# Patient Record
Sex: Male | Born: 1935 | Race: White | Hispanic: No | Smoking: Never smoker
Health system: Southern US, Community
[De-identification: ages and names within clinical notes are randomized; demographics above are authoritative.]

## PROBLEM LIST (undated history)

## (undated) DIAGNOSIS — J449 Chronic obstructive pulmonary disease, unspecified: Secondary | ICD-10-CM

## (undated) DIAGNOSIS — F419 Anxiety disorder, unspecified: Secondary | ICD-10-CM

## (undated) DIAGNOSIS — S60219A Contusion of unspecified wrist, initial encounter: Secondary | ICD-10-CM

## (undated) DIAGNOSIS — N400 Enlarged prostate without lower urinary tract symptoms: Secondary | ICD-10-CM

## (undated) DIAGNOSIS — F4310 Post-traumatic stress disorder, unspecified: Secondary | ICD-10-CM

## (undated) DIAGNOSIS — F0390 Unspecified dementia without behavioral disturbance: Secondary | ICD-10-CM

## (undated) DIAGNOSIS — R296 Repeated falls: Secondary | ICD-10-CM

## (undated) DIAGNOSIS — I2510 Atherosclerotic heart disease of native coronary artery without angina pectoris: Secondary | ICD-10-CM

## (undated) HISTORY — PX: OTHER SURGICAL HISTORY: SHX169

---

## 2000-08-08 ENCOUNTER — Ambulatory Visit (HOSPITAL_COMMUNITY): Admission: RE | Admit: 2000-08-08 | Discharge: 2000-08-08 | Payer: Self-pay | Admitting: Family Medicine

## 2000-08-08 ENCOUNTER — Encounter: Payer: Self-pay | Admitting: Family Medicine

## 2000-08-10 ENCOUNTER — Ambulatory Visit (HOSPITAL_COMMUNITY): Admission: RE | Admit: 2000-08-10 | Discharge: 2000-08-10 | Payer: Self-pay | Admitting: Family Medicine

## 2000-08-10 ENCOUNTER — Encounter: Payer: Self-pay | Admitting: Family Medicine

## 2000-08-13 ENCOUNTER — Encounter: Payer: Self-pay | Admitting: Family Medicine

## 2000-08-13 ENCOUNTER — Ambulatory Visit (HOSPITAL_COMMUNITY): Admission: RE | Admit: 2000-08-13 | Discharge: 2000-08-13 | Payer: Self-pay | Admitting: Family Medicine

## 2000-08-15 ENCOUNTER — Encounter (HOSPITAL_COMMUNITY): Admission: RE | Admit: 2000-08-15 | Discharge: 2000-09-14 | Payer: Self-pay | Admitting: Family Medicine

## 2001-02-26 ENCOUNTER — Encounter: Payer: Self-pay | Admitting: Emergency Medicine

## 2001-02-26 ENCOUNTER — Emergency Department (HOSPITAL_COMMUNITY): Admission: EM | Admit: 2001-02-26 | Discharge: 2001-02-26 | Payer: Self-pay | Admitting: Emergency Medicine

## 2001-03-19 ENCOUNTER — Ambulatory Visit (HOSPITAL_COMMUNITY): Admission: RE | Admit: 2001-03-19 | Discharge: 2001-03-19 | Payer: Self-pay | Admitting: Family Medicine

## 2001-03-19 ENCOUNTER — Encounter: Payer: Self-pay | Admitting: Family Medicine

## 2001-03-26 ENCOUNTER — Other Ambulatory Visit: Admission: RE | Admit: 2001-03-26 | Discharge: 2001-03-26 | Payer: Self-pay | Admitting: Dermatology

## 2001-05-04 ENCOUNTER — Encounter: Payer: Self-pay | Admitting: Family Medicine

## 2001-05-04 ENCOUNTER — Ambulatory Visit (HOSPITAL_COMMUNITY): Admission: RE | Admit: 2001-05-04 | Discharge: 2001-05-04 | Payer: Self-pay | Admitting: Family Medicine

## 2001-12-06 ENCOUNTER — Ambulatory Visit (HOSPITAL_COMMUNITY): Admission: RE | Admit: 2001-12-06 | Discharge: 2001-12-06 | Payer: Self-pay | Admitting: Family Medicine

## 2001-12-06 ENCOUNTER — Encounter: Payer: Self-pay | Admitting: Family Medicine

## 2001-12-10 ENCOUNTER — Ambulatory Visit (HOSPITAL_COMMUNITY): Admission: RE | Admit: 2001-12-10 | Discharge: 2001-12-10 | Payer: Self-pay | Admitting: Pulmonary Disease

## 2002-08-12 ENCOUNTER — Ambulatory Visit (HOSPITAL_COMMUNITY): Admission: RE | Admit: 2002-08-12 | Discharge: 2002-08-12 | Payer: Self-pay | Admitting: Ophthalmology

## 2002-12-09 ENCOUNTER — Ambulatory Visit (HOSPITAL_COMMUNITY): Admission: RE | Admit: 2002-12-09 | Discharge: 2002-12-09 | Payer: Self-pay | Admitting: Ophthalmology

## 2003-03-06 ENCOUNTER — Ambulatory Visit (HOSPITAL_COMMUNITY): Admission: RE | Admit: 2003-03-06 | Discharge: 2003-03-06 | Payer: Self-pay | Admitting: Family Medicine

## 2003-05-06 ENCOUNTER — Ambulatory Visit (HOSPITAL_COMMUNITY): Admission: RE | Admit: 2003-05-06 | Discharge: 2003-05-06 | Payer: Self-pay | Admitting: Family Medicine

## 2004-04-02 ENCOUNTER — Emergency Department (HOSPITAL_COMMUNITY): Admission: EM | Admit: 2004-04-02 | Discharge: 2004-04-02 | Payer: Self-pay | Admitting: *Deleted

## 2004-07-12 ENCOUNTER — Ambulatory Visit (HOSPITAL_COMMUNITY): Admission: RE | Admit: 2004-07-12 | Discharge: 2004-07-12 | Payer: Self-pay | Admitting: Family Medicine

## 2004-08-24 ENCOUNTER — Ambulatory Visit (HOSPITAL_COMMUNITY): Admission: RE | Admit: 2004-08-24 | Discharge: 2004-08-24 | Payer: Self-pay | Admitting: Family Medicine

## 2005-01-08 ENCOUNTER — Emergency Department (HOSPITAL_COMMUNITY): Admission: EM | Admit: 2005-01-08 | Discharge: 2005-01-08 | Payer: Self-pay | Admitting: Emergency Medicine

## 2005-05-27 ENCOUNTER — Emergency Department (HOSPITAL_COMMUNITY): Admission: EM | Admit: 2005-05-27 | Discharge: 2005-05-27 | Payer: Self-pay | Admitting: Emergency Medicine

## 2005-06-04 ENCOUNTER — Emergency Department (HOSPITAL_COMMUNITY): Admission: EM | Admit: 2005-06-04 | Discharge: 2005-06-04 | Payer: Self-pay | Admitting: Emergency Medicine

## 2005-06-16 ENCOUNTER — Inpatient Hospital Stay (HOSPITAL_COMMUNITY): Admission: EM | Admit: 2005-06-16 | Discharge: 2005-06-18 | Payer: Self-pay | Admitting: Emergency Medicine

## 2005-06-18 ENCOUNTER — Ambulatory Visit: Payer: Self-pay | Admitting: Internal Medicine

## 2005-10-27 ENCOUNTER — Ambulatory Visit (HOSPITAL_COMMUNITY): Admission: RE | Admit: 2005-10-27 | Discharge: 2005-10-27 | Payer: Self-pay | Admitting: Family Medicine

## 2006-10-16 ENCOUNTER — Ambulatory Visit (HOSPITAL_COMMUNITY): Admission: RE | Admit: 2006-10-16 | Discharge: 2006-10-16 | Payer: Self-pay

## 2006-11-09 ENCOUNTER — Encounter (HOSPITAL_COMMUNITY): Admission: RE | Admit: 2006-11-09 | Discharge: 2006-12-09 | Payer: Self-pay | Admitting: Neurology

## 2006-12-14 ENCOUNTER — Encounter (HOSPITAL_COMMUNITY): Admission: RE | Admit: 2006-12-14 | Discharge: 2007-01-13 | Payer: Self-pay | Admitting: Neurology

## 2007-04-25 ENCOUNTER — Ambulatory Visit (HOSPITAL_COMMUNITY): Admission: RE | Admit: 2007-04-25 | Discharge: 2007-04-25 | Payer: Self-pay | Admitting: Family Medicine

## 2007-06-08 ENCOUNTER — Ambulatory Visit (HOSPITAL_COMMUNITY): Admission: RE | Admit: 2007-06-08 | Discharge: 2007-06-08 | Payer: Self-pay | Admitting: Family Medicine

## 2007-08-17 ENCOUNTER — Ambulatory Visit (HOSPITAL_COMMUNITY): Admission: RE | Admit: 2007-08-17 | Discharge: 2007-08-17 | Payer: Self-pay | Admitting: Family Medicine

## 2007-09-07 ENCOUNTER — Ambulatory Visit (HOSPITAL_COMMUNITY): Admission: RE | Admit: 2007-09-07 | Discharge: 2007-09-07 | Payer: Self-pay | Admitting: Family Medicine

## 2007-09-18 ENCOUNTER — Ambulatory Visit (HOSPITAL_COMMUNITY): Admission: RE | Admit: 2007-09-18 | Discharge: 2007-09-18 | Payer: Self-pay | Admitting: Pulmonary Disease

## 2008-03-15 ENCOUNTER — Emergency Department (HOSPITAL_COMMUNITY): Admission: EM | Admit: 2008-03-15 | Discharge: 2008-03-15 | Payer: Self-pay | Admitting: Emergency Medicine

## 2008-07-04 ENCOUNTER — Inpatient Hospital Stay (HOSPITAL_COMMUNITY): Admission: EM | Admit: 2008-07-04 | Discharge: 2008-07-09 | Payer: Self-pay | Admitting: Emergency Medicine

## 2008-07-04 ENCOUNTER — Encounter: Payer: Self-pay | Admitting: Orthopedic Surgery

## 2008-07-08 ENCOUNTER — Encounter: Payer: Self-pay | Admitting: Orthopedic Surgery

## 2008-07-09 ENCOUNTER — Encounter: Payer: Self-pay | Admitting: Orthopedic Surgery

## 2008-07-09 ENCOUNTER — Inpatient Hospital Stay
Admission: AD | Admit: 2008-07-09 | Discharge: 2011-02-03 | Disposition: A | Discharge status: Other Acute Inpatient Hospital | Payer: PRIVATE HEALTH INSURANCE | Source: Home / Self Care | Attending: Internal Medicine | Admitting: Internal Medicine

## 2008-07-09 DIAGNOSIS — I714 Abdominal aortic aneurysm, without rupture: Secondary | ICD-10-CM

## 2008-07-16 ENCOUNTER — Ambulatory Visit: Payer: Self-pay | Admitting: Orthopedic Surgery

## 2008-07-16 DIAGNOSIS — M25569 Pain in unspecified knee: Secondary | ICD-10-CM

## 2008-07-16 DIAGNOSIS — S52539A Colles' fracture of unspecified radius, initial encounter for closed fracture: Secondary | ICD-10-CM | POA: Insufficient documentation

## 2008-07-17 ENCOUNTER — Encounter: Payer: Self-pay | Admitting: Orthopedic Surgery

## 2008-07-17 ENCOUNTER — Ambulatory Visit (HOSPITAL_COMMUNITY): Admission: RE | Admit: 2008-07-17 | Discharge: 2008-07-17 | Payer: Self-pay | Admitting: Orthopedic Surgery

## 2008-07-30 ENCOUNTER — Telehealth: Payer: Self-pay | Admitting: Orthopedic Surgery

## 2008-08-04 ENCOUNTER — Telehealth: Payer: Self-pay | Admitting: Orthopedic Surgery

## 2008-08-07 ENCOUNTER — Encounter: Payer: Self-pay | Admitting: Orthopedic Surgery

## 2008-08-14 ENCOUNTER — Ambulatory Visit (HOSPITAL_COMMUNITY): Admission: RE | Admit: 2008-08-14 | Discharge: 2008-08-14 | Payer: Self-pay | Admitting: Family Medicine

## 2008-08-20 ENCOUNTER — Ambulatory Visit: Payer: Self-pay | Admitting: Orthopedic Surgery

## 2008-09-02 ENCOUNTER — Ambulatory Visit: Payer: Self-pay | Admitting: Internal Medicine

## 2008-09-04 ENCOUNTER — Ambulatory Visit (HOSPITAL_COMMUNITY): Admission: RE | Admit: 2008-09-04 | Discharge: 2008-09-04 | Payer: Self-pay | Admitting: Internal Medicine

## 2008-09-04 ENCOUNTER — Encounter: Payer: Self-pay | Admitting: Internal Medicine

## 2008-09-08 ENCOUNTER — Ambulatory Visit: Payer: Self-pay | Admitting: Internal Medicine

## 2008-09-08 ENCOUNTER — Encounter: Payer: Self-pay | Admitting: Internal Medicine

## 2008-09-08 ENCOUNTER — Ambulatory Visit (HOSPITAL_COMMUNITY): Admission: RE | Admit: 2008-09-08 | Discharge: 2008-09-08 | Payer: Self-pay | Admitting: Internal Medicine

## 2008-09-08 DIAGNOSIS — R1319 Other dysphagia: Secondary | ICD-10-CM

## 2008-09-09 ENCOUNTER — Encounter: Payer: Self-pay | Admitting: Internal Medicine

## 2008-09-16 ENCOUNTER — Ambulatory Visit (HOSPITAL_COMMUNITY): Admission: RE | Admit: 2008-09-16 | Discharge: 2008-09-16 | Payer: Self-pay | Admitting: Internal Medicine

## 2008-09-18 ENCOUNTER — Telehealth: Payer: Self-pay | Admitting: Orthopedic Surgery

## 2008-09-18 ENCOUNTER — Ambulatory Visit: Payer: Self-pay | Admitting: Orthopedic Surgery

## 2008-09-18 DIAGNOSIS — M1710 Unilateral primary osteoarthritis, unspecified knee: Secondary | ICD-10-CM | POA: Insufficient documentation

## 2009-01-07 ENCOUNTER — Emergency Department (HOSPITAL_COMMUNITY): Admission: EM | Admit: 2009-01-07 | Discharge: 2009-01-07 | Payer: Self-pay | Admitting: Emergency Medicine

## 2009-02-20 ENCOUNTER — Ambulatory Visit (HOSPITAL_COMMUNITY): Admission: RE | Admit: 2009-02-20 | Discharge: 2009-02-20 | Payer: Self-pay | Admitting: Internal Medicine

## 2009-03-21 ENCOUNTER — Ambulatory Visit (HOSPITAL_COMMUNITY): Admission: RE | Admit: 2009-03-21 | Discharge: 2009-03-21 | Payer: Self-pay | Admitting: Internal Medicine

## 2009-03-26 ENCOUNTER — Ambulatory Visit: Payer: Self-pay | Admitting: Internal Medicine

## 2009-03-26 DIAGNOSIS — K222 Esophageal obstruction: Secondary | ICD-10-CM

## 2009-03-27 ENCOUNTER — Ambulatory Visit (HOSPITAL_COMMUNITY): Admission: RE | Admit: 2009-03-27 | Discharge: 2009-03-27 | Payer: Self-pay | Admitting: Internal Medicine

## 2009-03-30 ENCOUNTER — Ambulatory Visit (HOSPITAL_COMMUNITY): Admission: RE | Admit: 2009-03-30 | Discharge: 2009-03-30 | Payer: Self-pay | Admitting: Internal Medicine

## 2009-04-07 ENCOUNTER — Encounter: Payer: Self-pay | Admitting: Internal Medicine

## 2009-04-14 ENCOUNTER — Encounter: Payer: Self-pay | Admitting: Gastroenterology

## 2009-06-05 ENCOUNTER — Ambulatory Visit (HOSPITAL_COMMUNITY): Admission: RE | Admit: 2009-06-05 | Discharge: 2009-06-05 | Payer: Self-pay | Admitting: Internal Medicine

## 2009-09-03 ENCOUNTER — Ambulatory Visit (HOSPITAL_COMMUNITY): Admission: RE | Admit: 2009-09-03 | Discharge: 2009-09-03 | Payer: Self-pay | Admitting: Internal Medicine

## 2009-10-18 ENCOUNTER — Ambulatory Visit: Admission: AD | Admit: 2009-10-18 | Discharge: 2009-10-18 | Payer: Self-pay | Admitting: Internal Medicine

## 2009-12-15 ENCOUNTER — Ambulatory Visit: Payer: Self-pay | Admitting: Orthopedic Surgery

## 2010-02-08 LAB — GLUCOSE, CAPILLARY: Glucose-Capillary: 85 mg/dL (ref 70–99)

## 2010-02-14 ENCOUNTER — Encounter (HOSPITAL_BASED_OUTPATIENT_CLINIC_OR_DEPARTMENT_OTHER): Payer: Self-pay | Admitting: Internal Medicine

## 2010-02-15 ENCOUNTER — Encounter: Payer: Self-pay | Admitting: Internal Medicine

## 2010-02-23 NOTE — Miscellaneous (Signed)
Summary: Nursing Home order  Nursing Home order   Imported By: Cammie Sickle 12/18/2009 14:22:19  _____________________________________________________________________  External Attachment:    Type:   Image     Comment:   External Document

## 2010-02-23 NOTE — Assessment & Plan Note (Signed)
Summary: requesting shots in [email or web address].cbt   Visit Type:  Follow-up Referring Provider:  [facility] Primary Provider:  Renard Matter  CC:  bilateral knee pain.  History of Present Illness: this is an 77 year old male, who is not a surgical candidate, who has bilateral knee osteoarthritis and presents for repeat injections. Last injection in August. We had a long discussion with him and his granddaughter and he says that the shot only helped a couple of days, maybe 3 days and then the pain comes back. He does take Tylenol and oxycodone for pain in his knees.  However, as well to get the shots.  2 shots were given one in each knee pain osteoarthritis   Verbal consent was obtained. The RIGHT knee was prepped with alcohol and ethyl chloride. 1 cc of depomedrol 40mg /cc and 4 cc of lidocaine 1% was injected. there were no complications.  Verbal consent was obtained. The knee was prepped with alcohol and ethyl chloride. 1 cc of depomedrol 40mg /cc and 4 cc of lidocaine 1% was injected. there were no complications.         Allergies: No Known Drug Allergies   Impression & Recommendations:  Problem # 1:  KNEE, ARTHRITIS, DEGEN./OSTEO (ICD-715.96) Assessment Deteriorated  Orders: Joint Aspirate / Injection, Large (20610) Depo- Medrol 40mg  (J1030)  Patient Instructions: 1)  You have received an injection of cortisone today. You may experience increased pain at the injection site. Apply ice pack to the area for 20 minutes every 2 hours and take 2 xtra strength tylenol every 8 hours. This increased pain will usually resolve in 24 hours. The injection will take effect in 3-10 days.  2)  Please schedule a follow-up appointment as needed.   Orders Added: 1)  Joint Aspirate / Injection, Large [20610] 2)  Depo- Medrol 40mg  [J1030]

## 2010-02-23 NOTE — Letter (Signed)
Summary: BPE ORDER  BPE ORDER   Imported By: Ave Filter 03/26/2009 14:26:49  _____________________________________________________________________  External Attachment:    Type:   Image     Comment:   External Document

## 2010-02-23 NOTE — Assessment & Plan Note (Signed)
Summary: e30,choking sensation.glu   Visit Type:  f/u Referring Provider:  [facility] Primary Care Provider:  Renard Matter  Chief Complaint:  choking sensation.  History of Present Illness: Mr. Jeremiah Johnson is a pleasant 77 year old gentleman who presents today for followup difficulty swallowing. He was last seen in August of 2010 at time of endoscopy. Please see findings below. The esophagus was dilated. He Is a poor historian due to a baseline Parkinson's/dementia. Apparently over the last several weeks he's had increasing episodes of "choking" with meals. According to his nephew-in-law, the nurses had to due the Heimlich Maneuver on him recently. At times he chews his food "forever" and still cannot swallow it and has to then spit it out. He denies any abdominal pain, nausea vomiting, heartburn. His bowel movements are "unpredictable", but he denies constipation or significant diarrhea. Denies blood in his stool or melena. Denies any weight loss, abd pain. Denies difficulty swallowing pills.  BPE prior to EGD in 8/10 also showed some esophageal dysmotility.  Current Medications (verified): 1)  Avodart 0.5 Mg Caps (Dutasteride) .... Take 1 Tablet By Mouth Once A Day 2)  Midodrine Hcl 2.5 Mg Tabs (Midodrine Hcl) .... Take 1 Tablet By Mouth Three Times A Day 3)  Sinemet 25-100 Mg Tabs (Carbidopa-Levodopa) .... Take 1 Tablet By Mouth Four Times A Day 4)  Meclizine 12.5 Mg .... As Needed 5)  Lexapro 20 Mg Tabs (Escitalopram Oxalate) .... Take 1 Tablet By Mouth Once A Day 6)  Flomax 0.4 Mg Caps (Tamsulosin Hcl) .... Take 1 Tablet By Mouth Once A Day 7)  Protonix 40 Mg Tbec (Pantoprazole Sodium) .... Take 1 Tablet By Mouth Once A Day 8)  Klonopin 0.5 Mg Tabs (Clonazepam) .... Take 1 Tab By Mouth At Bedtime 9)  Health Shakes .... Twice Daily 10)  Remeron 15 Mg Tabs (Mirtazapine) .... 1/2 Tablet Daily 11)  Colace 100 Mg Caps (Docusate Sodium) .... As Needed 12)  Benadryl 25 Mg Caps  (Diphenhydramine Hcl) .... As Needed 13)  Tylenol 650 Mg .... As Needed 14)  Oxyfast 20 Mg/ml .... 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 Moderate size hiatal hernia, otherwise normal stomach, D1, D2.  Family History: Father: Deceased  unknown Mother: Deceased age 46  Siblings: 10 brothers and 2 sisters No FH of CRC. Son deceased age 24, melanoma, also required renal transplant for unclear reasons, (donated by patient).  Social History: Widow 2 sons, deceased Retired Curator. Served in [employer].  Remote tob use, quit 1949. No alcohol.  Review of Systems General:  Denies fever, chills, and weight loss. ENT:  Complains of difficulty swallowing; denies nasal congestion and hoarseness. CV:  Denies chest pains, palpitations, dyspnea on exertion, and peripheral edema. Resp:  Denies dyspnea at rest, dyspnea with exercise, cough, and sputum. GI:  See HPI. MS:  Complains of joint pain / LOM. Derm:  Denies rash. Neuro:  Denies tremors. Endo:  Denies unusual weight change.  Vital Signs:  Patient profile:   77 year old male Height:      68.5  inches Temp:     97.9 degrees F oral Pulse rate:   60 / minute BP sitting:   110 / 68  (left arm) Cuff size:   regular  Vitals Entered By: Cloria Spring LPN (March 26, 1608 1:33 PM)  Physical Exam  General:  Well developed, well nourished, no acute distress. Head:  Normocephalic and atraumatic. Eyes:  Conjunctivae pink, no scleral icterus.  Mouth:  OP moist. Lungs:   Clear throughout to auscultation. Heart:  Regular rate and rhythm; no murmurs, rubs,  or bruits. Abdomen:  Bowel sounds normal.  Abdomen is soft, nontender, nondistended.  No rebound or guarding.  No hepatosplenomegaly, masses or hernias.  No abdominal bruits. Exam limited due to exam in wheelchair. Patient does not weight bear. Extremities:  No clubbing, cyanosis, edema or deformities noted. Neurologic:  Alert and  oriented x4;  Skin:  Intact without significant lesions or rashes. Psych:  Alert and cooperative. Normal mood and affect.  Impression & Recommendations:  Problem # 1:  DYSPHAGIA (ICD-787.29)  H/O esophageal stricture/Schatzki ring s/p dilation in 8/10. Component of esophageal dysmotility on BPE at that time. DDX includes recurrent esophageal stricture +/- oropharyngeal dysphagia +/- esophageal motility disorder. Recommend he chew food thoroughly and sit upright for all meals. Barium pill esophagram as next step. Further recommendations to follow.   Orders: Est. Patient Level III (96045)    I would like to thank Dr. Leanord Hawking for allowing [place] to take part in the care of this nice patient.

## 2010-02-23 NOTE — Letter (Signed)
Summary: internal other/consult report/03/26/2009  internal other/consult report/03/26/2009   Imported By: Cloria Spring LPN 78/46/9629 52:84:13  _____________________________________________________________________  External Attachment:    Type:   Image     Comment:   External Document

## 2010-02-23 NOTE — Letter (Signed)
Summary: Internal Other/Info to [facility]  Internal Other/Info to [facility]   Imported By: Cloria Spring LPN 16/10/9602 54:09:81  _____________________________________________________________________  External Attachment:    Type:   Image     Comment:   External Document

## 2010-02-25 ENCOUNTER — Ambulatory Visit (HOSPITAL_COMMUNITY)
Admit: 2010-02-25 | Discharge: 2010-02-25 | Disposition: A | Discharge status: Home / Self Care | Payer: Medicare Other | Attending: Internal Medicine | Admitting: Internal Medicine

## 2010-02-25 DIAGNOSIS — M7989 Other specified soft tissue disorders: Secondary | ICD-10-CM | POA: Insufficient documentation

## 2010-02-25 DIAGNOSIS — I743 Embolism and thrombosis of arteries of the lower extremities: Secondary | ICD-10-CM | POA: Insufficient documentation

## 2010-04-05 LAB — GLUCOSE, CAPILLARY: Glucose-Capillary: 91 mg/dL (ref 70–99)

## 2010-04-06 LAB — GLUCOSE, CAPILLARY: Glucose-Capillary: 209 mg/dL — ABNORMAL HIGH (ref 70–99)

## 2010-04-08 LAB — GLUCOSE, CAPILLARY
Glucose-Capillary: 137 mg/dL — ABNORMAL HIGH (ref 70–99)
Glucose-Capillary: 28 mg/dL — CL (ref 70–99)
Glucose-Capillary: 75 mg/dL (ref 70–99)
Glucose-Capillary: 82 mg/dL (ref 70–99)

## 2010-04-09 LAB — GLUCOSE, CAPILLARY: Glucose-Capillary: 83 mg/dL (ref 70–99)

## 2010-04-10 LAB — GLUCOSE, CAPILLARY
Glucose-Capillary: 111 mg/dL — ABNORMAL HIGH (ref 70–99)
Glucose-Capillary: 224 mg/dL — ABNORMAL HIGH (ref 70–99)

## 2010-04-11 LAB — GLUCOSE, CAPILLARY
Glucose-Capillary: 104 mg/dL — ABNORMAL HIGH (ref 70–99)
Glucose-Capillary: 176 mg/dL — ABNORMAL HIGH (ref 70–99)

## 2010-04-12 LAB — GLUCOSE, CAPILLARY: Glucose-Capillary: 101 mg/dL — ABNORMAL HIGH (ref 70–99)

## 2010-04-27 LAB — CBC
Hemoglobin: 11.7 g/dL — ABNORMAL LOW (ref 13.0–17.0)
MCHC: 33.3 g/dL (ref 30.0–36.0)
MCV: 96.8 fL (ref 78.0–100.0)
RBC: 3.64 MIL/uL — ABNORMAL LOW (ref 4.22–5.81)
RDW: 13.4 % (ref 11.5–15.5)

## 2010-04-27 LAB — PROTIME-INR: INR: 1.21 (ref 0.00–1.49)

## 2010-05-03 LAB — BASIC METABOLIC PANEL
BUN: 17 mg/dL (ref 6–23)
BUN: 26 mg/dL — ABNORMAL HIGH (ref 6–23)
CO2: 26 mEq/L (ref 19–32)
Calcium: 8.6 mg/dL (ref 8.4–10.5)
Chloride: 108 mEq/L (ref 96–112)
GFR calc non Af Amer: 46 mL/min — ABNORMAL LOW (ref >60)
Glucose, Bld: 116 mg/dL — ABNORMAL HIGH (ref 70–99)
Glucose, Bld: 99 mg/dL (ref 70–99)
Potassium: 3.9 mEq/L (ref 3.5–5.1)
Potassium: 4.1 mEq/L (ref 3.5–5.1)
Sodium: 139 mEq/L (ref 135–145)

## 2010-05-03 LAB — DIFFERENTIAL
Basophils Absolute: 0 10*3/uL (ref 0.0–0.1)
Basophils Relative: 0 % (ref 0–1)
Eosinophils Absolute: 0 10*3/uL (ref 0.0–0.7)
Eosinophils Relative: 1 % (ref 0–5)
Lymphocytes Relative: 19 % (ref 12–46)
Monocytes Absolute: 0.7 10*3/uL (ref 0.1–1.0)

## 2010-05-03 LAB — CBC
HCT: 33.5 % — ABNORMAL LOW (ref 39.0–52.0)
Hemoglobin: 12 g/dL — ABNORMAL LOW (ref 13.0–17.0)
MCHC: 36 g/dL (ref 30.0–36.0)
MCV: 94.6 fL (ref 78.0–100.0)
Platelets: 104 10*3/uL — ABNORMAL LOW (ref 150–400)
RDW: 12.9 % (ref 11.5–15.5)

## 2010-05-03 LAB — URINALYSIS, ROUTINE W REFLEX MICROSCOPIC
Bilirubin Urine: NEGATIVE
Ketones, ur: NEGATIVE mg/dL
Nitrite: NEGATIVE
Protein, ur: NEGATIVE mg/dL
pH: 5.5 (ref 5.0–8.0)

## 2010-05-03 LAB — POCT CARDIAC MARKERS: CKMB, poc: <1 ng/mL — ABNORMAL LOW (ref 1.0–8.0)

## 2010-06-08 NOTE — Group Therapy Note (Signed)
NAME:  Jeremiah Johnson, Jeremiah Johnson                 ACCOUNT NO.:  [id number]   MEDICAL RECORD NO.:  [id number]          PATIENT TYPE:  INP   LOCATION:  A323                          FACILITY:  [facility]   PHYSICIAN:  Angus G. Renard Matter, MD   DATE OF BIRTH:  Sep 06, 1918   DATE OF PROCEDURE:  DATE OF DISCHARGE:                                 PROGRESS NOTE   This patient has parkinsonism, was admitted following a fall and injury  to his right wrist, medial side of his right leg.  His condition  remained stable.  He was mentally confused through the night.  He was  admitted for pain control and placement in a nursing facility.  He  states that he would like to go home.   OBJECTIVE:  VITAL SIGNS:  Blood pressure 150/84, respirations 18, pulse  67, and temperature 98.4.  LUNGS:  Clear to P and A.  HEART:  Regular rhythm.  ABDOMEN:  No palpable organs or masses.  EXTREMITIES:  The patient has slightly deformed right wrist, small  abrasions in hand, and tenderness in medial thigh.   ASSESSMENT:  The patient was admitted following a fall.  He does have  contusion of his right wrist, evidence of Parkinsonism; therefore, it is  being observed for possible fracture in the right wrist.   PLAN:  To discuss with family today the options.  The patient does not  want to go to nursing facility, would rather go home to his apartment.  With the altered mental status through the night, we felt that he needed  to stay here today while his plans are being worked out regarding his  placement.      Angus G. Renard Matter, MD  Electronically Signed     AGM/MEDQ  D:  07/08/2008  T:  07/08/2008  Job:  [id number]

## 2010-06-08 NOTE — Op Note (Signed)
NAME:  Jeremiah Johnson, Jeremiah Johnson                 ACCOUNT NO.:  [id number]   MEDICAL RECORD NO.:  [id number]          PATIENT TYPE:  AMB   LOCATION:  DAY                           FACILITY:  [facility]   PHYSICIAN:  R. Roetta Sessions, M.D. DATE OF BIRTH:  [birth year]   DATE OF PROCEDURE:  09/08/2008  DATE OF DISCHARGE:                               OPERATIVE REPORT   PROCEDURE PERFORMED:  Esophagogastroduodenoscopy with Elease Hashimoto dilation  and biopsy.   INDICATIONS FOR PROCEDURE:  An 77 year old gentleman with a single  episode of esophageal dysphagia describes recently.  I saw him in the  office last week, obtained barium pill esophagram which revealed a  smooth narrowing of the EG junction where the 13-mm pill became stuck  and eventually disintegrated.  He comes for EGD with dilation as  appropriate.  Risks, benefits reviewed with Mr. [name], questions  answered, he is agreeable.   PROCEDURE NOTE:  O2 saturation, blood pressure, pulse, respirations  monitored throughout the entire procedure.   CONSCIOUS SEDATION:  Versed 3 mg IV, Demerol 50 mg IV divided doses.  Cetacaine spray for topical pharyngeal anesthesia.   INSTRUMENT USED:  Pentax video chip system.   FINDINGS:  Examination of the tubular esophagus revealed a peptic  appearing stricture with a component of a ring at the GE junction there  was a little heaped up inflamed appearing mucosa on the gastric side  circumferentially.  This did not appear to be a malignant stricture.  EG  junction was traversed with minimal resistance with a diagnostic  gastroscope.  Stomach:  Gas cavity was empty.  It insufflated well with  air.  Thorough examination of gastric mucosa including retroflexion in  proximal stomach and esophagogastric junction demonstrated only moderate  size hiatal hernia.  Pylorus was patent and easily traversed.  Examination of the bulb and second portion revealed no abnormalities.   THERAPEUTIC/DIAGNOSTIC MANEUVERS PERFORMED:  Scope  was withdrawn.  The  56-French [facility] dilator was passed to full insertion with ease.  A  look back revealed the stricture have been dilated nicely with minimal  bleeding without apparent complication.  Subsequently elected to biopsy  the somewhat heaped up tissue at the EG junction to __________  This was  done without difficulty or apparent complication.  The patient tolerated  the procedure well, was reacted in endoscopy.   IMPRESSION:  1. Inflammatory likely reflux induced stricture with a component of a      Schatzki's ring as described above status post dilation and biopsy.  2. Moderate size hiatal hernia, otherwise normal stomach, D1, D2.   RECOMMENDATIONS:  1. Begin Protonix 40 mg orally once daily, follow-up on path.  2. Further recommendations to follow.      Jonathon Bellows, M.D.  Electronically Signed     RMR/MEDQ  D:  09/08/2008  T:  09/08/2008  Job:  [id number]   cc:   Maxwell Caul, M.D.

## 2010-06-08 NOTE — Procedures (Signed)
NAME:  Jeremiah Johnson, Jeremiah Johnson                 ACCOUNT NO.:  [id number]   MEDICAL RECORD NO.:  [id number]          PATIENT TYPE:  OUT   LOCATION:  RESP                          FACILITY:  [facility]   PHYSICIAN:  Edward L. Juanetta Gosling, M.D.DATE OF BIRTH:  05-03-1918   DATE OF PROCEDURE:  DATE OF DISCHARGE:                            PULMONARY FUNCTION TEST   1. Spirometry shows a moderate ventilatory defect with evidence of      airflow obstruction most marked in the smaller airways.  2. Lung volumes show total lung capacity is 81% predicted, which is      normal.  However, it is borderline.  3. DLCO is mildly reduced.  4. Arterial blood gases are normal.  5. There is no significant bronchodilator improvement.      Edward L. Juanetta Gosling, M.D.  Electronically Signed     ELH/MEDQ  D:  09/08/2007  T:  09/08/2007  Job:  [id number]   cc:   Angus G. Renard Matter, MD  Fax: [phone]

## 2010-06-08 NOTE — H&P (Signed)
NAME:  Jeremiah Johnson, Jeremiah Johnson                 ACCOUNT NO.:  [id number]   MEDICAL RECORD NO.:  [id number]          PATIENT TYPE:  INP   LOCATION:  A323                          FACILITY:  [facility]   PHYSICIAN:  Angus G. Renard Matter, MD   DATE OF BIRTH:  [birth year]   DATE OF ADMISSION:  07/04/2008  DATE OF DISCHARGE:  LH                              HISTORY & PHYSICAL   HISTORY:  An 77 year old white male sent to the emergency room with a  chief problem being multiple falls.  Apparently, the patient fell  getting out of his car yesterday, did not lose consciousness.  The  patient does have a history of parkinsonism.  He has pain in his right  wrist.  X-rays showed slight diffuse osteopenia, no other acute findings  noted.  The patient lives alone.  He does have parkinsonism and  difficulty with movement.  The emergency room physician and the patient  felt that he should be admitted for placement and for pain control.   SOCIAL HISTORY:  The patient does not smoke or drink alcohol or use  drugs.   FAMILY HISTORY:  Positive for coronary artery disease and hypertension.   SURGICAL HISTORY:  The patient gave on one kidney in 1973.   PAST MEDICAL HISTORY:  1. Anxiety.  2. Parkinsonism.  3. Post-traumatic stress disorder which occurred as a result of World      War II.  4. The patient does have evidence of COPD, BPH, and coronary artery      disease.   ALLERGIES:  No drug allergies.   REVIEW OF SYSTEMS:  HEENT:  Negative except for occasional dyspnea.  GI:  No bowel irregularity or bleeding.  GU:  No dysuria or hematuria.   PHYSICAL EXAMINATION:  GENERAL:  Alert male.  VITAL SIGNS:  Blood pressure 129/73, respirations 18, pulse 65, and  temperature 97.5.  HEENT:  Eyes, PERRLA.  TMs negative.  Oropharynx benign.  NECK:  Supple.  No JVD or thyroid abnormalities.  LUNGS:  Clear to P and A.  HEART:  Regular rhythm.  ABDOMEN:  No palpable organs or masses.  No organomegaly.  EXTREMITIES:  Free of  edema.  His wrist is slightly deformed, tender to  palpation on right, small abrasion on dorsum of the right hand.   PERTINENT LABORATORY DATA:  Hemoglobin 12.0 and hematocrit of 33.5.  Chemistries within normal limits with exception of slight elevation of  glucose 116.  Urinalysis is essentially negative.   ASSESSMENT:  The patient was admitted with following a fall.  He does  have parkinsonism, has a contusion of his right wrist, observe for  possible fracture of his right wrist.      Angus G. Renard Matter, MD  Electronically Signed     AGM/MEDQ  D:  07/04/2008  T:  07/05/2008  Job:  [id number]

## 2010-06-08 NOTE — Group Therapy Note (Signed)
NAME:  Jeremiah Johnson, Jeremiah Johnson                 ACCOUNT NO.:  [id number]   MEDICAL RECORD NO.:  [id number]          PATIENT TYPE:  ORB   LOCATION:  S128                          FACILITY:  [facility]   PHYSICIAN:  Angus G. Renard Matter, MD   DATE OF BIRTH:  Dec 08, 1918   DATE OF PROCEDURE:  DATE OF DISCHARGE:                                 PROGRESS NOTE   SUBJECTIVE:  This patient was admitted to this facility following a  recent hospitalization.  He had a fall with injury to right wrist,  fracture of right wrist.  He does have a history of dementia and not  Parkinsonism.  Has remained stable in the facility.  We requested that  he will be seen by orthopedist, Dr. Romeo Apple.   OBJECTIVE:  VITAL SIGNS:  Blood pressure 146/87, respirations 20, pulse  60, temperature 97.  LUNGS:  Clear to P and A.  HEART:  Regular rhythm.  ABDOMEN:  No palpable organs or masses.   ASSESSMENT:  The patient has some slight tenderness of right wrist and  slight tenderness of medial thigh.   PLAN:  To continue current regimen.  Medication list reviewed.      Angus G. Renard Matter, MD  Electronically Signed     AGM/MEDQ  D:  07/15/2008  T:  07/16/2008  Job:  [id number]

## 2010-06-08 NOTE — Discharge Summary (Signed)
NAME:  Jeremiah Johnson, Jeremiah Johnson                 ACCOUNT NO.:  [id number]   MEDICAL RECORD NO.:  [id number]          PATIENT TYPE:  INP   LOCATION:  A323                          FACILITY:  [facility]   PHYSICIAN:  Angus G. Renard Matter, MD   DATE OF BIRTH:  October 29, 1918   DATE OF ADMISSION:  07/04/2008  DATE OF DISCHARGE:  06/16/2010LH                               DISCHARGE SUMMARY   DIAGNOSES:  1. Parkinsonism.  2. Contusion of right wrist, possible fracture.  3. Dementia.  4. Post-traumatic stress disorder.  5. History of chronic obstructive pulmonary disease.  6. Benign prostatic hypertrophy.  7. Coronary artery disease.   The patient's condition is stable at time of his discharge.   An 77 year old white male was sent to the emergency room with chief  problem being multiple falls.  Apparently the patient fell getting into  his car, did not lose consciousness.  The patient does have a history of  parkinsonism.  He has pain in his right wrist.  X-ray showed slight  diffuse osteopenia but no acute fracture noted.  The patient lives  alone.  Does have parkinsonism and difficulty with movement.  Emergency  room physician and the patient felt that he should be admitted for  placement and pain control.   EXAMINATION:  Alert male complaining of discomfort in his right arm.  VITAL SIGNS:  Blood pressure 129/73, respiration 18, pulse 65,  temperature 97.5.  HEENT:  Eyes:  PERRLA.  TMs negative.  Oropharynx benign.  NECK:  Supple.  No JVD or thyroid abnormalities.  LUNGS:  Clear to P and A.  HEART:  Regular rhythm.  ABDOMEN:  No palpable organs or masses.  No organomegaly.  EXTREMITIES:  Free of edema.  Wrist slightly deformed, tender to  palpation on the right.  Small abrasion on the dorsum of the right hand.   LABORATORY DATA:  CBC on admission:  WBC 8400, hemoglobin 12.0,  hematocrit 33.5.  BMET:  Sodium 139, potassium 4.1, chloride 108, CO2  26, glucose 116, BUN 17, creatinine 1.09, GFR greater than  60.  Urinalysis essentially negative.  TSH 0.743.  BNP on June 14 essentially  normal.   X-RAYS, RADIOLOGY:  Initial set of x-rays showed no acute findings,  advanced distal radiocarpal degenerative joint disease.  Slight diffuse  osteopenia.  Second x-ray:  Dorsal deformity of radius.  A buckle  fracture in this vicinity could not be excluded.  CT recommended.  X-ray  of his femur:  No acute findings, arteries with severe peripheral  arterial calcification.  X-ray of pelvis:  No acute findings.   HOSPITAL COURSE:  The patient at the time of his admission was placed on  a regular diet and vital signs were monitored.  He was placed on Vicodin  5/500 every 4 hours p.r.n. for pain.  Uroxatral 10 mg daily was  continued.  Sinemet 25/100 one t.i.d. was continued.  Voltaren 75 mg  b.i.d. continued.  He was given subcutaneous Lovenox daily.  The patient  continued to have some pain with his wrist throughout his hospital stay.  An orthopedic  consult was requested but the patient was not seen by  orthopedist.  It was rather obvious that the patient needed placement in  a nursing facility.  At times through the night he was mentally confused  but other times through the day, he appeared to be more alert.  A repeat  x-ray of his right wrist was done which still did not show evidence of  fracture, but a CT of the wrist was recommended to rule out more  detailed and possibly underlying subacute fracture.  Arrangements were  made for the patient be placed in a nursing facility.  He was able to be  discharged.   Discharged on the following medications:  1. Carbidopa/levodopa 25/100 one t.i.d.  2. __________ 2.5 mg t.i.d.  3. Prozac 20 mg daily.  4. Xanax 0.25 mg b.i.d. and 2 at bedtime.  5. Uroxatral 10 mg daily.  6. Avodart 0.5 mg daily.  7. Meclizine 12.5 mg q.4 h. p.r.n.   The patient was stable and improved at the time of his discharge.      Angus G. Renard Matter, MD  Electronically  Signed     AGM/MEDQ  D:  07/09/2008  T:  07/09/2008  Job:  [id number]

## 2010-06-08 NOTE — H&P (Signed)
NAME:  Jeremiah Johnson, Jeremiah Johnson                 ACCOUNT NO.:  [id number]   MEDICAL RECORD NO.:  [id number]          PATIENT TYPE:  INP   LOCATION:  A323                          FACILITY:  [facility]   PHYSICIAN:  Angus G. Renard Matter, MD   DATE OF BIRTH:  1918-02-12   DATE OF ADMISSION:  07/04/2008  DATE OF DISCHARGE:  LH                              HISTORY & PHYSICAL   ADDENDUM   MEDICATION LIST:  1. Mobic 7.5 mg daily.  2. Uroxatral 10 mg daily.  3. Sinemet 25/100 t.i.d.  4. Prozac 20 mg daily.      Angus G. Renard Matter, MD  Electronically Signed     AGM/MEDQ  D:  07/04/2008  T:  07/05/2008  Job:  [id number]

## 2010-06-08 NOTE — Group Therapy Note (Signed)
NAME:  Jeremiah Johnson, Jeremiah Johnson                 ACCOUNT NO.:  [id number]   MEDICAL RECORD NO.:  [id number]          PATIENT TYPE:  INP   LOCATION:  A323                          FACILITY:  [facility]   PHYSICIAN:  Angus G. Renard Matter, MD   DATE OF BIRTH:  19-Dec-1918   DATE OF PROCEDURE:  DATE OF DISCHARGE:                                 PROGRESS NOTE   SUBJECTIVE:  This patient has parkinsonism, who was admitted following a  fall and injury to his right wrist and medial side of his right leg.  His condition remains stable.  He was admitted for pain control and for  placement in nursing facility.   OBJECTIVE:  VITAL SIGNS:  Blood pressure 102/60, respirations 20, pulse  69, and temperature 97.1.  LUNGS:  Clear to P and A.  HEART:  Regular rhythm.  ABDOMEN:  No palpable organs or masses.  EXTREMITIES:  The patient has a slightly deformed right wrist, tender to  palpation on the right, small abrasions on the right hand, and  tenderness in the medial thigh.   ASSESSMENT:  The patient was admitted following a fall, does have  contusion of his right wrist, evidence of parkinsonism, observed for  possible fractures of the right wrist.   PLAN:  To obtain Orthopedic consult today and proceed placement in  nursing facility.      Angus G. Renard Matter, MD  Electronically Signed     AGM/MEDQ  D:  07/07/2008  T:  07/07/2008  Job:  [id number]

## 2010-06-08 NOTE — Consult Note (Signed)
NAME:  Jeremiah Johnson, Jeremiah Johnson                 ACCOUNT NO.:  [id number]   MEDICAL RECORD NO.:  [id number]          PATIENT TYPE:  INP   LOCATION:  A323                          FACILITY:  [facility]   PHYSICIAN:  Kofi A. Gerilyn Pilgrim, M.D. DATE OF BIRTH:  Dec 15, 1918   DATE OF CONSULTATION:  07/08/2008  DATE OF DISCHARGE:  07/09/2008                                 CONSULTATION   REASON FOR CONSULTATION:  Falls and Parkinson disease.   The patient is an 77 year old, who is seen in my office for parkinsonism  and orthostatic hypotension.  The patient has done well with midodrine  for orthostatic hypotension associated with gait instability and falls.  The patient was last seen in the office approximately 3 months ago and  he was doing fairly well.  He was ambulating with a cane and did not  report problems falling.  He has been maintained on midodrine 2.5 t.i.d.  and Sinemet 25/100 t.i.d.  The patient was seen on the repeat request of  the family to see the patient at a inpatient basis.  I did look over his  medication list and it is unclear that he is on midodrine, although it  was given to him from our office in February.  Please note that this  date of service for this note is July 08, 2008.  The patient reports  that he got out of his car and this did stand up and took a step and  stumbled and fell hitting the right hand while falling to the ground.  The patient specifically denies any loss of consciousness.  He does not  complain of having any lightheadedness or dizziness.  The patient did  sustain a fracture of the right wrist, however.   PAST MEDICAL HISTORY:  Significant for Parkinson disease, orthostatic  hypotension, abdominal aortic aneurysm, depression, PTSD,  osteoarthritis, anxiety disorder, COPD, benign prostatic hypertrophy.  There is also history of coronary artery disease.   SURGERIES:  None.   MEDICATIONS:  1. Prozac 20 mg.  2. Sinemet 25/100 t.i.d.  3. Midodrine 2.5 mg t.i.d.,  although it is unclear if the patient is      taking this.  4. Meclizine.  5. He also has Xanax, although it is unclear if he has taken Xanax 2.5      b.i.d.   PAST SURGICAL HISTORY:  The patient had a nephrectomy as a kidney  donation in 39.   FAMILY HISTORY:  It is positive for coronary disease and hypertension.   SOCIAL HISTORY:  The patient lives alone.  He has been relatively  functional doing all his ADLs and driving.   ALLERGIES:  None known.   REVIEW OF SYSTEMS:  Unremarkable other than stated in the history of  present illness.   PHYSICAL EXAMINATION:  GENERAL:  Shows a thin, pleasant gentleman, in no  acute distress.  VITAL SIGNS:  Temperature 98.3, pulse 69, respirations 80, blood  pressure 128/78, previous blood pressure 161/92.  HEENT EVALUATION:  Neck is supple.  Head is normocephalic, atraumatic.  EXTREMITIES:  Shows right forearm and hand to be  in the brace.  ABDOMEN:  Soft.  EXTREMITIES:  No significant edema.  MENTATION:  The patient is awake and alert.  He is somewhat heard of  hearing.  He does not recognize me.  He does follow commands well.  He  is oriented x2.  Speech is normal.  CRANIAL NERVE EVALUATION:  Pupils are equal, round, and react to light  and accommodation.  Extraocular movements are full.  Facial muscle  strength is symmetric.  Tongue is midline.  Uvula midline.  Shoulder  shrug normal.  MOTOR:  Obvious weakness of the right forearm and hand proximally,  actually pretty greater in right upper extremity.  He has pretty good  strength on other extremities.  Bulk and tone is normal.  COORDINATION:  I see no evidence of parkinsonism at this time.  He  actually has good movements.  No significant bradykinesia or cogwheeling  noted.  No tremors noted.  Reflexes are preserved.   ASSESSMENT AND PLAN:  Gait impairment likely multifactorial and include  osteoarthritis, parkinsonism, and orthostatic hypotension.  The  orthostatic hypotension is  likely related to parkinsonism.  I think, we  should restart the patient back on midodrine 2.5 mg t.i.d., continue  physical therapy, and minimize other psychotropic medication such as  benzodiazepines.      Kofi A. Gerilyn Pilgrim, M.D.  Electronically Signed     KAD/MEDQ  D:  07/15/2008  T:  07/16/2008  Job:  [id number]

## 2010-06-11 NOTE — Group Therapy Note (Signed)
NAME:  Jeremiah Johnson, Jeremiah Johnson                 ACCOUNT NO.:  [id number]   MEDICAL RECORD NO.:  [id number]          PATIENT TYPE:  OBV   LOCATION:  A327                          FACILITY:  [facility]   PHYSICIAN:  Angus G. Renard Matter, MD   DATE OF BIRTH:  1918-05-04   DATE OF PROCEDURE:  DATE OF DISCHARGE:                                   PROGRESS NOTE   This patient was admitted through the ED with nausea, vomiting, diarrhea,  and weakness.  He continues to have episode of diarrhea, had several stools  through the night, still feels weak.  Does not feel that he can manage at  home.   OBJECTIVE:  VITAL SIGNS:  Blood pressure 158/70, respirations 20, pulse 64,  temperature 98.4.  HEART:  Regular rhythm.  LUNGS:  Clear to P&A.  ABDOMEN:  Slight tenderness.   ASSESSMENT:  The patient was admitted with diarrhea and vomiting, was felt  to have a viral gastroenteritis most likely, although other etiologies  should be considered as well.   Plan to continue current IV fluids.  Repeat CBC, BMET.  Continue current  regimen.      Angus G. Renard Matter, MD  Electronically Signed     AGM/MEDQ  D:  06/16/2005  T:  06/16/2005  Job:  [id number]

## 2010-06-11 NOTE — Group Therapy Note (Signed)
NAME:  Jeremiah Johnson, Jeremiah Johnson                 ACCOUNT NO.:  [id number]   MEDICAL RECORD NO.:  [id number]          PATIENT TYPE:  OBV   LOCATION:  A327                          FACILITY:  [facility]   PHYSICIAN:  Angus G. Renard Matter, MD   DATE OF BIRTH:  10-Apr-1918   DATE OF PROCEDURE:  06/15/2005  DATE OF DISCHARGE:                                   PROGRESS NOTE   SUBJECTIVE:  This patient was admitted through the ED with nausea, vomiting,  diarrhea and weakness.  He continues to have some diarrhea, but is feeling  some better.   OBJECTIVE:  VITAL SIGNS:  Blood pressure 128/74, respirations 20, pulse 64,  temperature 97.4.  The patient's current WBC is 6700 with 78% neutrophils.  HEART:  Regular rhythm.  LUNGS:  Clear to P&A.  ABDOMEN:  No palpable organs  or masses.   ASSESSMENT:  The patient was admitted with nausea, vomiting and diarrhea.  He was felt to have a viral gastroenteritis.   PLAN:  I plan to continue current IV fluids and advance diet.      Angus G. Renard Matter, MD  Electronically Signed     AGM/MEDQ  D:  06/15/2005  T:  06/15/2005  Job:  [id number]

## 2010-06-11 NOTE — H&P (Signed)
Jeremiah Johnson, Jeremiah Johnson                 ACCOUNT NO.:  [id number]   MEDICAL RECORD NO.:  [id number]          PATIENT TYPE:  OBV   LOCATION:  A327                          FACILITY:  [facility]   PHYSICIAN:  Catalina Pizza, M.D.        DATE OF BIRTH:  1918/10/21   DATE OF ADMISSION:  06/14/2005  DATE OF DISCHARGE:  LH                                HISTORY & PHYSICAL   PRIMARY MEDICAL DOCTOR:  Angus G. Renard Matter, MD   NEUROLOGIST:  Kofi A. [place]onquah, M.D.   CHIEF COMPLAINT:  Nausea, vomiting, diarrhea, and weakness.   HISTORY OF PRESENT ILLNESS:  This is an 77 year old gentleman who was in  normal state of health yesterday afternoon, went to dinner last night, had a  steak, and at approximately 11 p.m. last night developed diarrhea, and then  nausea and vomiting. He has had several episodes of nausea and vomiting, and  several episodes of diarrhea. No hematochezia. No dark tarry stools. No  black stools. No blood in stools. The patient has  moderate discomfort in  his lower abdomen. He states he has had something similar to this years ago  and has not had any sick contacts that he knows of.   PAST MEDICAL HISTORY:  1.  PTSD secondary to WWII.  2.  Parkinson's disease.   PAST SURGICAL HISTORY:  1.  Appendectomy while in the service in 1945.  2.  He gave up one of his kidneys in 1973 to his son.   He has not had any other surgeries.   MEDICATIONS:  1.  Prozac 20 mg p.o. daily.  2.  Buspar 15 mg b.i.d.  3.  Xanax 0.25 mg p.r.n.  4.  Sinemet 25/100 t.i.d.  5.  A previous prescription for Zyrtec 10 mg p.o. daily.   REVIEW OF SYSTEMS:  The patient denies any chest pain. No headache. No  visual disturbance. No problems with dysphagia. No shortness of breath. Only  mentioned some lower abdominal pain. No significant problems with urination.  No lower extremity pain. No other neurologic complaints.   SOCIAL HISTORY:  The patient lives by himself in an apartment. He does not  smoke or drink. He has  several brothers and sisters who live in the area and  he is the oldest. He had one son who passed away of melanoma, but also had a  kidney transplant. He has one son that lives in [place], is an Radio producer.   FAMILY HISTORY:  Noncontributory.   OBJECTIVE:  VITAL SIGNS: Temperature is 98.1, blood pressure is 150/81,  respirations 16, pulse 80.  GENERAL: He is an elderly white male in no acute distress, lying in bed.  HEENT:  Pupils equal, round, and reactive to light and accommodation. No  scleral icterus. Oropharynx is moist. No JVD. No thyromegaly.  LUNGS: Clear to auscultation bilaterally.  CARDIOVASCULAR: Regular rate and rhythm. A questionable 1/6 systolic murmur  best appreciated at the left upper sternal border.  ABDOMEN: Soft, positive bowel sounds, some suprapubic tenderness to  palpation, but very mild. No hepatosplenomegaly.  EXTREMITIES: No  lower extremity edema. 2+ pulses in all extremities.  NEUROLOGIC: Cranial nerves II-XII intact. He does have a slow pill-rolling  tremor, worse in the right hand compared to the left.   Labwork obtained showed white count of 19,000, hemoglobin and platelet count  were within normal range. CMET revealed potassium of 5, BUN 19, and  creatinine 1.3 with a glucose of 183. Alkaline phosphatase was 136. All  other labs were normal with lipase of 19.   Abdominal film revealed multiple fluid levels, but no significant signs of  obstruction.   IMPRESSION:  This is an 77 year old gentleman with acute onset of nausea,  vomiting, diarrhea, and weakness.   ASSESSMENT/PLAN:  1.  Abdominal pain, nausea, vomiting, and weakness. The patient will be      given IV fluids. Very likely this is viral gastroenteritis-type picture.      At this time he has no acute abnormalities. Will treat with Zofran      p.r.n. and if he continues to have diarrhea may add on Imodium.  2.  PTSD.  Will continue on all medications that he was previously      prescribed for t  his.  3.  Parkinson's disease. Will continue on his Sinemet as previously      prescribed for this.   DISPOSITION:  The patient will be admitted for observation and will be  evaluated with CBC and BMET in the morning by Dr. Renard Matter to see if the  patient is feeling better, well enough to go home. He does live by himself  and is 77 years of age. He does have some family around which may be able to  assist him once the patient is well enough to go home.      Catalina Pizza, M.D.  Electronically Signed     ZH/MEDQ  D:  06/14/2005  T:  06/14/2005  Job:  [id number]

## 2010-06-11 NOTE — Consult Note (Signed)
NAME:  Jeremiah Johnson, Jeremiah Johnson                 ACCOUNT NO.:  [id number]   MEDICAL RECORD NO.:  [id number]          PATIENT TYPE:  INP   LOCATION:  A327                          FACILITY:  [facility]   PHYSICIAN:  Lionel December, M.D.    DATE OF BIRTH:  01/21/1919   DATE OF CONSULTATION:  DATE OF DISCHARGE:                                   CONSULTATION   REASON FOR CONSULTATION:  Diarrhea.   REQUESTING PHYSICIAN:  Angus G. McInnis, MD   HISTORY OF PRESENT ILLNESS:  Mr. Plack is an 77 year old Caucasian gentleman  who was admitted three days ago with acute onset of nausea, vomiting, and  diarrhea.  He went out to eat at a restaurant and had a [place] steak,  mashed potatoes, and green beans.  That evening around midnight, he woke up  with diarrhea, nausea and vomiting.  His vomiting has improved at this  point, but he continues to have diarrhea.  Yesterday, he had 10 stools.  Today, he has already had more than six.  He denies any blood in the stool  or melena.  He is having some abdominal discomfort which he feels is related  to cramps and gas.  No fever.  T max was 99.1.  He denies any recent  antibiotic use.  He has not been traveling or camping.  No ill contacts.  Generally, before this illness, he had regular bowel movements.  He has  intermittent heartburn related to certain foods.  No weight loss.   On admission, his white count was 19,100 with 92% neutrophils.  Today his  white count is 6700.  He is heme negative x2.  C. diff, O&P, and WBC all  negative.  No stool culture was ordered.  He had a CT of the abdomen and  pelvis, which revealed acute abdominal aortic aneurysm measuring 3.4 x 3.6  cm, status post left nephrectomy, minimal dilatation of intra- and extra  hepatobiliary ducts, prostatic enlargement.  LFTs are normal.  Lipase 19.   MEDICATIONS PRIOR TO ADMISSION:  1.  Prozac 20 mg daily.  2.  BuSpar 15 mg b.i.d.  3.  Xanax 0.25 mg t.i.d. p.r.n.  4.  Sinemet 25/100 mg t.i.d.  5.   Zyrtec 10 mg daily p.r.n.   ALLERGIES:  No known drug allergies.   PAST MEDICAL HISTORY:  1.  Post-traumatic stress disorder, World War II.  2.  Parkinson's disease.  3.  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  4.  Status post appendectomy.  5.  Patient has never had a colonoscopy and was actually being scheduled for      one before he recently fell and injured his chin, requiring stitches.      He postponed his appointment.   FAMILY HISTORY:  Negative for colorectal cancer.  Son died of melanoma at  age 40 and required liver transplant 28 years prior to that for some sort of  kidney disorder but not cancer.   SOCIAL HISTORY:  He is widowed.  Two sons who are deceased.  He has a  granddaughter, two  great-grandchildren.  He is retired from being a  Curator.  He served in World War II.  Remote tobacco use, quit in 1949.  No  alcohol use.   REVIEW OF SYSTEMS:  See HPI for GI and constitutional.  CARDIOPULMONARY:  No  chest pain or shortness of breath.   PHYSICAL EXAMINATION:  VITAL SIGNS:  Temp 98.1, pulse 70, respirations 18,  blood pressure 125/79.  Weight 171.6.  Height 68 inches.  GENERAL:  A pleasant elderly Caucasian male in no acute distress.  SKIN:  Warm and dry.  No jaundice.  HEENT:  Conjunctivae are pink.  Sclerae are anicteric.  Oropharyngeal mucosa  moist and pink.  NECK:  No lymphadenopathy.  CHEST:  Lungs are clear to auscultation.  HEART:  Regular rate and rhythm.  Normal S1 and S2.  No murmurs, rubs or  gallops.  ABDOMEN:  Positive bowel sounds.  Soft, nontender, nondistended.  No  organomegaly or masses.  No rebound tenderness or guarding.  No abdominal  bruits or hernias.  EXTREMITIES:  No edema.   LABS:  As mentioned in the HPI.  In addition, hemoglobin is 13.1, hematocrit  38.1.  Platelets on admission were 160,000, down to 121,000.  Sodium 137,  potassium 4.1, BUN 9, creatinine 1.2, glucose 95.  Total bilirubin 0.9,  alkaline  phosphatase 136, AST 28, ALT 14, albumin 4.1.   IMPRESSION:  Patient is an 77 year old gentleman with acute onset of nausea,  vomiting, and diarrhea, now for 4-5 days duration.  Initially, his white  count was significantly elevated, and now it has normalized.  Stools are  hemoccult negative.  No stool culture was obtained, but C. diff, O&P, and  WBC were all negative.  Suspect gastroenteritis, possibly food-borne  illness, bacterial gastroenteritis.  C. diff less likely with no recent  antibiotic use.  Doubt viral gastroenteritis, given basic CBC and  differential, which is more indicative for bacterial infection.   RECOMMENDATIONS:  1.  Stool culture, repeat C. diff.  2.  Imodium p.r.n.  3.  Consider Cipro and Flagyl p.o., to discuss further with Dr. Karilyn Cota.  4.  If he is not significantly better in the next 24-48 hours, may consider      flexible sigmoidoscopy.      Tana Coast, P.A.      Lionel December, M.D.  Electronically Signed    LL/MEDQ  D:  06/17/2005  T:  06/17/2005  Job:  [id number]   cc:   Angus G. Renard Matter, MD  Fax: [phone]

## 2010-06-11 NOTE — Procedures (Signed)
   NAME:  Jeremiah Johnson, Jeremiah Johnson                           ACCOUNT NO.:  [id number]   MEDICAL RECORD NO.:  [id number]                   PATIENT TYPE:  OUT   LOCATION:  DFTL                                 FACILITY:  [facility]   PHYSICIAN:  Edward L. Juanetta Gosling, M.D.             DATE OF BIRTH:  14-May-1918   DATE OF PROCEDURE:  12/10/2001  DATE OF DISCHARGE:                                    STRESS TEST   INDICATIONS FOR PROCEDURE:  The patient is undergoing graded exercise  testing basically as part of a physical examination.  He has no  contraindications to graded exercise testing.   DESCRIPTION OF PROCEDURE:  The patient exercised for 2 minutes and 30  seconds on the Bruce protocol reaching and sustaining 4.6 METS.  His maximum  recorded heart rate was 109, which is 78% of his age-predicted maximal heart  rate.  He had no symptoms during exercise and stopped exercise because of  arthritis in his knees.  Blood pressure response to exercise was normal, and  there were no electrocardiographic changes suggestive of inducible ischemia.   IMPRESSION:  1. Poor exercise tolerance.  2. Normal blood pressure response to exercise.  3. No evidence of ischemia.  4. No symptoms during exercise.  5. Submaximal exercise reached with 78% instead of 85% of age-predicted     maximal heart rate.                                               Edward L. Juanetta Gosling, M.D.    ELH/MEDQ  D:  12/10/2001  T:  12/10/2001  Job:  [id number]

## 2010-06-11 NOTE — Discharge Summary (Signed)
NAME:  Jeremiah Johnson, Jeremiah Johnson                 ACCOUNT NO.:  [id number]   MEDICAL RECORD NO.:  [id number]          PATIENT TYPE:  INP   LOCATION:  A327                          FACILITY:  [facility]   PHYSICIAN:  Angus G. Renard Matter, MD   DATE OF BIRTH:  January 09, 1919   DATE OF ADMISSION:  06/14/2005  DATE OF DISCHARGE:  05/26/2007LH                                 DISCHARGE SUMMARY   DIAGNOSES:  1.  Gastroenteritis, viral.  2.  History of abdominal aortic aneurysm.  3.  History of Parkinson's disease.   CONDITION ON DISCHARGE:  The patient's condition is stable at time of  discharge.   HISTORY:  This 77 year old male was in his normal state of health, went to  dinner, had a steak and at approximately 11 p.m. developed diarrhea, nausea  and vomiting.  He had several episodes of nausea and vomiting, several  episodes of diarrhea.  No hematochezia.  The patient was in moderate  discomfort at the time of his admission in his lower abdomen.   PHYSICAL EXAMINATION:  VITAL SIGNS:  Blood pressure 150/81, respirations 16,  pulse 80, temperature 98.1.  GENERAL:  An elderly male, lying in bed.  HEENT:  Eyes -- PERRLA; TM, nares oropharynx benign.  NECK:  Supple, no JVD or thyroid abnormalities.  LUNGS:  Clear to percussion and auscultation.  HEART:  Regular rhythm, no murmurs.  Questionable 1/6 systolic murmur, left  upper sternal border.  ABDOMEN:  Soft, no palpable organs nor masses.  No organomegaly.  EXTREMITIES:  Free of edema.  NEUROLOGIC:  Cranial nerves intact.  Does have a slow _pill________ -rolling  tremor.   LABORATORY DATA:  Initial CBC:  WBC 19,100, hemoglobin 15.1, hematocrit  44.1.  CBC (Jun 18, 2005):  WBC 5900, hemoglobin 12.7, hematocrit 37.4.  CHEMISTRIES (on admission):  Sodium 140, potassium 5, chloride 106, CO2 26,  glucose 183, BUN 19, creatinine 1.3, calcium 9.4.  CHEMISTRIES (Jun 17, 2005):  Sodium 137, potassium 4.1, chloride 116, CO2 18, glucose 95, BUN 9,  creatinine 1.2,  calcium 8.3.  LIVER ENZYMES:  SGOT 28, SGPT 14, alkaline  phosphatase 136,  bilirubin 0.9, lipase 19.  Urinalysis negative.  Stool  negative for C. difficile.  No ova or parasites.   X-RAYS:  Abdominal acute with PA of chest:  Cardiomegaly with mild chronic  bronchitic changes.  Nonspecific bowel gas pattern.  Fluid-filled loops of  bowel within the abdomen.  Abdominal CT:  Abdominal aortic aneurysm  measuring 2.8 x 3.5; no acute abdominal process.  Evidence of left  nephrectomy.  CT of the pelvis.  Prostate gland enlargement, no pelvic  abnormality.   HOSPITAL COURSE:  The patient at the time of his admission was placed on  intravenous fluids, normal saline at 100 cc/hr, liquids clear.  Vital signs  were monitored.  Zofran 4 mg IV was given every 8 hr p.r.n. for nausea.  His  regular medications were continued shortly after admission (Prozac 20 mg  daily, BuSpar 15 mg b.i.d., Alprazolam 0.25 mg t.i.d., Carbidopa/levodopa  p.o. t.i.d. 25/100).  The patient gradually improved  and was placed on a  soft diet on Jun 15, 2005.  His stools were examined for C. difficile, O&P,  __________ wbc's; this was negative.  The patient was given Immodium for  diarrhea which developed.   The patient showed progressive improvement and was ready to be discharged  after 2 days hospitalization to follow as an outpatient.   DISCHARGE MEDICATIONS:  1.  Immodium capsules one t.i.d.  2.  Phenergan 25 mg q.4 h. p.r.n. nausea.  3.  Prozac 20 mg daily.  4.  BuSpar 15 mg b.i.d.  5.  Cinemet 25/100 t.i.d.      Angus G. Renard Matter, MD  Electronically Signed     AGM/MEDQ  D:  07/05/2005  T:  07/05/2005  Job:  [id number]

## 2010-06-11 NOTE — Group Therapy Note (Signed)
NAME:  Jeremiah Johnson, Jeremiah Johnson                 ACCOUNT NO.:  [id number]   MEDICAL RECORD NO.:  [id number]          PATIENT TYPE:  INP   LOCATION:  A327                          FACILITY:  [facility]   PHYSICIAN:  Angus G. Renard Matter, MD   DATE OF BIRTH:  August 29, 1918   DATE OF PROCEDURE:  06/17/2005  DATE OF DISCHARGE:                                   PROGRESS NOTE   This patient was admitted through the hospital ED with nausea, vomiting and  diarrhea.  He continues to have episodes of diarrhea but his overall  condition appears to be improved some.   PHYSICAL EXAMINATION:  VITAL SIGNS:  Blood pressure is 149/82, respirations  20, pulse 61, temperature 97.3.  CARDIAC:  _normal_________ rhythm.  LUNGS:  Clear, diminished breath sounds.  ABDOMEN:  Midabdominal tenderness, hyperactive bowel sounds.   ASSESSMENT:  The patient has had diarrhea and some vomiting, viral  gastroenteritis most likely although other etiologies should be considered  as well.  Stool was negative for Clostridium difficile toxin.   PLAN:  Continue current IV antibiotics, continue Imodium.      Angus G. Renard Matter, MD  Electronically Signed     AGM/MEDQ  D:  06/17/2005  T:  06/17/2005  Job:  [id number]

## 2010-08-06 LAB — GLUCOSE, CAPILLARY: Glucose-Capillary: 92 mg/dL (ref 70–99)

## 2010-08-30 ENCOUNTER — Ambulatory Visit (HOSPITAL_COMMUNITY)
Admit: 2010-08-30 | Discharge: 2010-08-30 | Disposition: A | Discharge status: Skilled Nursing Facility | Payer: Medicare Other | Source: Skilled Nursing Facility | Attending: Internal Medicine | Admitting: Internal Medicine

## 2010-08-30 DIAGNOSIS — I714 Abdominal aortic aneurysm, without rupture, unspecified: Secondary | ICD-10-CM | POA: Insufficient documentation

## 2010-09-15 LAB — GLUCOSE, CAPILLARY: Glucose-Capillary: 224 mg/dL — ABNORMAL HIGH (ref 70–99)

## 2010-10-07 LAB — GLUCOSE, CAPILLARY: Glucose-Capillary: 91 mg/dL (ref 70–99)

## 2010-10-22 LAB — BLOOD GAS, ARTERIAL
Acid-base deficit: 3.5 — ABNORMAL HIGH
Patient temperature: 37
TCO2: 18.1
pH, Arterial: 7.409

## 2011-01-22 LAB — GLUCOSE, CAPILLARY: Glucose-Capillary: 142 mg/dL — ABNORMAL HIGH (ref 70–99)

## 2011-02-03 ENCOUNTER — Emergency Department (HOSPITAL_COMMUNITY): Payer: Medicare Other

## 2011-02-03 ENCOUNTER — Emergency Department (HOSPITAL_COMMUNITY)
Admission: EM | Admit: 2011-02-03 | Discharge: 2011-02-03 | Disposition: A | Discharge status: Skilled Nursing Facility | Payer: Medicare Other | Attending: Emergency Medicine | Admitting: Emergency Medicine

## 2011-02-03 ENCOUNTER — Encounter (HOSPITAL_COMMUNITY): Payer: Self-pay | Admitting: *Deleted

## 2011-02-03 ENCOUNTER — Inpatient Hospital Stay
Admission: RE | Admit: 2011-02-03 | Discharge: 2011-07-12 | Disposition: A | Discharge status: Other Acute Inpatient Hospital | Payer: PRIVATE HEALTH INSURANCE | Source: Ambulatory Visit | Attending: Internal Medicine | Admitting: Internal Medicine

## 2011-02-03 DIAGNOSIS — F068 Other specified mental disorders due to known physiological condition: Secondary | ICD-10-CM | POA: Insufficient documentation

## 2011-02-03 DIAGNOSIS — G20 Parkinson's disease: Secondary | ICD-10-CM | POA: Insufficient documentation

## 2011-02-03 DIAGNOSIS — R05 Cough: Secondary | ICD-10-CM | POA: Insufficient documentation

## 2011-02-03 DIAGNOSIS — J449 Chronic obstructive pulmonary disease, unspecified: Secondary | ICD-10-CM | POA: Insufficient documentation

## 2011-02-03 DIAGNOSIS — R059 Cough, unspecified: Secondary | ICD-10-CM | POA: Insufficient documentation

## 2011-02-03 DIAGNOSIS — J189 Pneumonia, unspecified organism: Secondary | ICD-10-CM

## 2011-02-03 DIAGNOSIS — R509 Fever, unspecified: Secondary | ICD-10-CM | POA: Insufficient documentation

## 2011-02-03 DIAGNOSIS — G20A1 Parkinson's disease without dyskinesia, without mention of fluctuations: Secondary | ICD-10-CM | POA: Insufficient documentation

## 2011-02-03 DIAGNOSIS — J4489 Other specified chronic obstructive pulmonary disease: Secondary | ICD-10-CM | POA: Insufficient documentation

## 2011-02-03 DIAGNOSIS — R0989 Other specified symptoms and signs involving the circulatory and respiratory systems: Secondary | ICD-10-CM | POA: Insufficient documentation

## 2011-02-03 DIAGNOSIS — I2510 Atherosclerotic heart disease of native coronary artery without angina pectoris: Secondary | ICD-10-CM | POA: Insufficient documentation

## 2011-02-03 HISTORY — DX: Atherosclerotic heart disease of native coronary artery without angina pectoris: I25.10

## 2011-02-03 HISTORY — DX: Chronic obstructive pulmonary disease, unspecified: J44.9

## 2011-02-03 HISTORY — DX: Repeated falls: R29.6

## 2011-02-03 HISTORY — DX: Unspecified dementia, unspecified severity, without behavioral disturbance, psychotic disturbance, mood disturbance, and anxiety: F03.90

## 2011-02-03 HISTORY — DX: Contusion of unspecified wrist, initial encounter: S60.219A

## 2011-02-03 HISTORY — DX: Post-traumatic stress disorder, unspecified: F43.10

## 2011-02-03 HISTORY — DX: Benign prostatic hyperplasia without lower urinary tract symptoms: N40.0

## 2011-02-03 HISTORY — DX: Anxiety disorder, unspecified: F41.9

## 2011-02-03 LAB — DIFFERENTIAL
Lymphocytes Relative: 17 % (ref 12–46)
Lymphs Abs: 0.9 10*3/uL (ref 0.7–4.0)
Monocytes Relative: 9 % (ref 3–12)
Neutro Abs: 3.7 10*3/uL (ref 1.7–7.7)
Neutrophils Relative %: 71 % (ref 43–77)

## 2011-02-03 LAB — CBC
Hemoglobin: 11.6 g/dL — ABNORMAL LOW (ref 13.0–17.0)
RBC: 3.68 MIL/uL — ABNORMAL LOW (ref 4.22–5.81)
WBC: 5.2 10*3/uL (ref 4.0–10.5)

## 2011-02-03 LAB — GLUCOSE, CAPILLARY: Glucose-Capillary: 91 mg/dL (ref 70–99)

## 2011-02-03 LAB — BASIC METABOLIC PANEL
BUN: 19 mg/dL (ref 6–23)
Chloride: 104 mEq/L (ref 96–112)
Glucose, Bld: 114 mg/dL — ABNORMAL HIGH (ref 70–99)
Potassium: 4.2 mEq/L (ref 3.5–5.1)

## 2011-02-03 MED ORDER — LEVOFLOXACIN 500 MG PO TABS: 500.0000 mg | ORAL_TABLET | Freq: Every day | ORAL | Status: AC

## 2011-02-03 MED ORDER — ACETAMINOPHEN 325 MG PO TABS
650.0000 mg | ORAL_TABLET | Freq: Once | ORAL | Status: AC
  Administered 2011-02-03: 650 mg via ORAL
  Filled 2011-02-03: qty 2

## 2011-02-03 MED ORDER — LEVOFLOXACIN 500 MG PO TABS
500.0000 mg | ORAL_TABLET | Freq: Once | ORAL | Status: AC
  Administered 2011-02-03: 500 mg via ORAL
  Filled 2011-02-03: qty 1

## 2011-02-03 NOTE — ED Notes (Signed)
Remains resting in bed with eyes closed and lights on. No distress. Equal chest rise and fall. Will continue to monitor.

## 2011-02-03 NOTE — ED Notes (Signed)
Per nursing home, patient has had chest congestion and fever of 102 today.

## 2011-02-03 NOTE — ED Notes (Signed)
Assessing patient at this time. No distress. Equal chest rise and fall, regular, nonlabored. States he has had a cough, URI symptoms starting this morning. Has been coughing up yellow sputum. Clear lung sounds in all fields. Denies any other symptoms. Breathing 20x\minute. Bowel sounds active. Abdomen soft with no tenderness.

## 2011-02-03 NOTE — ED Notes (Signed)
MD at bedside to speak with patient and patient's family about plan of care. 

## 2011-02-03 NOTE — ED Notes (Signed)
Resting comfortably in bed on right side. No distress. Equal chest rise and fall, regular, nonlabored. Call bell and family a bedside. Temp rechecked. 99.4 oral. Given warm blanket per request. Family denies needs.

## 2011-02-03 NOTE — ED Notes (Signed)
Temp rechecked. 99.9 oral. Family at bedside with patient. Denies any needs.

## 2011-02-03 NOTE — ED Notes (Signed)
Attempted IV access x 2 unsuccessful attempts. Able to draw blood from one site. Patient in no distress. Equal chest rise and fall. Resting with eyes closed. Tolerated blood draw well.

## 2011-02-03 NOTE — ED Notes (Signed)
Report given to Drinda Butts, RN at [facility].

## 2011-02-03 NOTE — ED Notes (Signed)
Patient back from radiology. Attempting IV access at this time.

## 2011-02-03 NOTE — ED Provider Notes (Signed)
History  Scribed for Joya Gaskins, MD, the patient was seen in room APAH1/APAH1. This chart was scribed by Candelaria Stagers. The patient's care started at 8:48 PM    CSN: [id number]  Arrival date & time 02/03/11  Barry Brunner   First MD Initiated Contact with Patient 02/03/11 2007      Chief Complaint  Patient presents with  . Fever  . URI     The history is provided by the nursing home, a relative and the patient. The history is limited by the condition of the patient.   Jeremiah Johnson is a 78 y.o. male who presents to the Emergency Department per nursing home with fever of 102 that started today.  Nursing home states that he has had chest congestion.  A family member reports he has been coughing more than usual today.  He denies nausea, vomiting, and chest pain. Extremities are contracted at baseline.    No abd pain No vomiting reported No other complaints at this time History limited due to dementia  Past Medical History  Diagnosis Date  . Falls frequently   . Parkinson's disease   . Wrist contusion   . Dementia   . PTSD (post-traumatic stress disorder)   . COPD (chronic obstructive pulmonary disease)   . BPH (benign prostatic hyperplasia)   . CAD (coronary artery disease)   . Anxiety     Past Surgical History  Procedure Date  . None     History reviewed. No pertinent family history.  History  Substance Use Topics  . Smoking status: Never Smoker   . Smokeless tobacco: Not on file  . Alcohol Use: No      Review of Systems  Unable to perform ROS: Dementia  Constitutional: Positive for fever.  HENT: Positive for congestion.   Respiratory: Positive for cough.   Cardiovascular: Negative for chest pain.  Gastrointestinal: Negative for nausea and vomiting.    Allergies  Review of patient's allergies indicates no known allergies.  Home Medications     BP 146/57  Pulse 66  Temp(Src) 100.8 F (38.2 C) (Oral)  Resp 20  Ht 5\' 8"  (1.727 m)  Wt 152 lb  (68.947 kg)  BMI 23.11 kg/m2  SpO2 96%  BP 118/48  Pulse 57  Temp(Src) 99.3 F (37.4 C) (Oral)  Resp 18  Ht 5\' 8"  (1.727 m)  Wt 152 lb (68.947 kg)  BMI 23.11 kg/m2  SpO2 95%   Physical Exam CONSTITUTIONAL: Well developed/well nourished HEAD AND FACE: Normocephalic/atraumatic EYES: EOMI/PERRL ENMT: Mucous membranes dry, no stridor noted NECK: supple no meningeal signs SPINE:entire spine nontender CV: S1/S2 noted, no murmurs/rubs/gallops noted LUNGS: course breath sounds bilaterally.  ABDOMEN: soft, nontender, no rebound or guarding NEURO: Pt is awake/alert, tremor noted (parkinson's) EXTREMITIES: pulses normal, contracture of extremities at baseline.  SKIN: warm, color normal PSYCH: no abnormalities of mood noted  ED Course  Procedures  DIAGNOSTIC STUDIES: Oxygen Saturation is 96% on room air, normal by my interpretation.    COORDINATION OF CARE: 8:08PM Ordered: CBC ; Differential ; Basic metabolic panel ; DG Chest 2 View ; acetaminophen (TYLENOL) tablet 650 mg  8:34PM Ordered: DG Chest 1 View (Comment: Modified from DG Chest 2 View)  9:06PM Ordered: levofloxacin (LEVAQUIN) tablet 500 mg   Labs Reviewed  CBC - Abnormal; Notable for the following:    RBC 3.68 (*)    Hemoglobin 11.6 (*)    HCT 34.5 (*)    Platelets 109 (*)    All other  components within normal limits  BASIC METABOLIC PANEL - Abnormal; Notable for the following:    Glucose, Bld 114 (*)    GFR calc non Af Amer 59 (*)    GFR calc Af Amer 68 (*)    All other components within normal limits  DIFFERENTIAL   Dg Chest 1 View  02/03/2011  *RADIOLOGY REPORT*  Clinical Data: Fever And cough with dementia.  CHEST - 1 VIEW  Comparison: 10/18/2009.  Findings: Positioning is suboptimal due to the patient's inability to cooperate.  The heart is enlarged.  There is retrocardiac density which it is incompletely evaluated. Left lower lobe infiltrate not excluded. Hiatal hernia is noted.  Calcified tortuous aorta.   Tortuous brachiocephalic vessels.  IMPRESSION: Retrocardiac density is incompletely evaluated due to the patient's inability to remain upright.  Early left lower lobe infiltrate not excluded.  Cardiomegaly.  Original Report Authenticated By: Elsie Stain, M.D.     Pt at baseline He requests d/c back to [facility] at bedside, they are agreeable as they prefer him to be managed at [facility] center No hypoxia, not septic appearing He can be managed as outpatient with levaquin  MDM  Nursing notes reviewed and considered in documentation All labs/vitals reviewed and considered xrays reviewed and considered   I personally performed the services described in this documentation, which was scribed in my presence. The recorded information has been reviewed and considered.          Joya Gaskins, MD 02/03/11 [phone]

## 2011-02-03 NOTE — ED Notes (Signed)
Lab at bedside to re-draw blood.

## 2011-07-12 ENCOUNTER — Encounter (HOSPITAL_COMMUNITY): Payer: Self-pay | Admitting: Emergency Medicine

## 2011-07-12 ENCOUNTER — Inpatient Hospital Stay (HOSPITAL_COMMUNITY)
Admission: EM | Admit: 2011-07-12 | Discharge: 2011-07-14 | DRG: 872 | Disposition: A | Discharge status: Skilled Nursing Facility | Payer: Medicare Other | Attending: Internal Medicine | Admitting: Internal Medicine

## 2011-07-12 DIAGNOSIS — J9819 Other pulmonary collapse: Secondary | ICD-10-CM | POA: Diagnosis present

## 2011-07-12 DIAGNOSIS — I2510 Atherosclerotic heart disease of native coronary artery without angina pectoris: Secondary | ICD-10-CM | POA: Diagnosis present

## 2011-07-12 DIAGNOSIS — Z66 Do not resuscitate: Secondary | ICD-10-CM | POA: Diagnosis present

## 2011-07-12 DIAGNOSIS — J4489 Other specified chronic obstructive pulmonary disease: Secondary | ICD-10-CM | POA: Diagnosis present

## 2011-07-12 DIAGNOSIS — A419 Sepsis, unspecified organism: Principal | ICD-10-CM | POA: Diagnosis present

## 2011-07-12 DIAGNOSIS — R5381 Other malaise: Secondary | ICD-10-CM | POA: Diagnosis present

## 2011-07-12 DIAGNOSIS — F4310 Post-traumatic stress disorder, unspecified: Secondary | ICD-10-CM | POA: Diagnosis present

## 2011-07-12 DIAGNOSIS — I498 Other specified cardiac arrhythmias: Secondary | ICD-10-CM | POA: Diagnosis present

## 2011-07-12 DIAGNOSIS — N390 Urinary tract infection, site not specified: Secondary | ICD-10-CM | POA: Diagnosis present

## 2011-07-12 DIAGNOSIS — E860 Dehydration: Secondary | ICD-10-CM | POA: Diagnosis present

## 2011-07-12 DIAGNOSIS — J449 Chronic obstructive pulmonary disease, unspecified: Secondary | ICD-10-CM | POA: Diagnosis present

## 2011-07-12 DIAGNOSIS — Z7982 Long term (current) use of aspirin: Secondary | ICD-10-CM

## 2011-07-12 DIAGNOSIS — D696 Thrombocytopenia, unspecified: Secondary | ICD-10-CM | POA: Diagnosis present

## 2011-07-12 DIAGNOSIS — Z515 Encounter for palliative care: Secondary | ICD-10-CM

## 2011-07-12 DIAGNOSIS — D6959 Other secondary thrombocytopenia: Secondary | ICD-10-CM | POA: Diagnosis present

## 2011-07-12 DIAGNOSIS — R627 Adult failure to thrive: Secondary | ICD-10-CM | POA: Diagnosis present

## 2011-07-12 DIAGNOSIS — D638 Anemia in other chronic diseases classified elsewhere: Secondary | ICD-10-CM | POA: Diagnosis present

## 2011-07-12 DIAGNOSIS — B964 Proteus (mirabilis) (morganii) as the cause of diseases classified elsewhere: Secondary | ICD-10-CM | POA: Diagnosis present

## 2011-07-12 DIAGNOSIS — F411 Generalized anxiety disorder: Secondary | ICD-10-CM | POA: Diagnosis present

## 2011-07-12 DIAGNOSIS — F0280 Dementia in other diseases classified elsewhere without behavioral disturbance: Secondary | ICD-10-CM | POA: Diagnosis present

## 2011-07-12 DIAGNOSIS — Z79899 Other long term (current) drug therapy: Secondary | ICD-10-CM

## 2011-07-12 NOTE — ED Notes (Signed)
Per staff at [facility], patient has had poor appetite, poor oral intake, increased confusion, not swallowing his medication and not as alert as he usually is.

## 2011-07-12 NOTE — ED Notes (Signed)
Per staff at [facility], patient has not been as alert as he usually is.  Patient has been running fever at facility.  Patient has not been swallowing his medication.

## 2011-07-12 NOTE — ED Notes (Signed)
Patients family member called for information on patient status. Informed her that it is a hippa violation to give information about a patient over the phone. She stated she was poa and was informed no information about the patient could be given to anyone over the phone.

## 2011-07-13 ENCOUNTER — Emergency Department (HOSPITAL_COMMUNITY): Payer: Medicare Other

## 2011-07-13 ENCOUNTER — Encounter (HOSPITAL_COMMUNITY): Payer: Self-pay | Admitting: *Deleted

## 2011-07-13 DIAGNOSIS — R627 Adult failure to thrive: Secondary | ICD-10-CM

## 2011-07-13 DIAGNOSIS — E782 Mixed hyperlipidemia: Secondary | ICD-10-CM

## 2011-07-13 DIAGNOSIS — A419 Sepsis, unspecified organism: Secondary | ICD-10-CM | POA: Diagnosis present

## 2011-07-13 DIAGNOSIS — A413 Sepsis due to Hemophilus influenzae: Secondary | ICD-10-CM

## 2011-07-13 DIAGNOSIS — N390 Urinary tract infection, site not specified: Secondary | ICD-10-CM | POA: Diagnosis present

## 2011-07-13 DIAGNOSIS — D696 Thrombocytopenia, unspecified: Secondary | ICD-10-CM | POA: Diagnosis present

## 2011-07-13 LAB — URINALYSIS, ROUTINE W REFLEX MICROSCOPIC
Ketones, ur: NEGATIVE mg/dL
Specific Gravity, Urine: 1.01 (ref 1.005–1.030)
Urobilinogen, UA: 0.2 mg/dL (ref 0.0–1.0)

## 2011-07-13 LAB — URINE MICROSCOPIC-ADD ON

## 2011-07-13 LAB — DIFFERENTIAL
Eosinophils Absolute: 0 10*3/uL (ref 0.0–0.7)
Eosinophils Relative: 0 % (ref 0–5)
Lymphs Abs: 0.7 10*3/uL (ref 0.7–4.0)
Monocytes Absolute: 0.9 10*3/uL (ref 0.1–1.0)
Monocytes Relative: 8 % (ref 3–12)
Neutrophils Relative %: 85 % — ABNORMAL HIGH (ref 43–77)

## 2011-07-13 LAB — CBC
HCT: 34.4 % — ABNORMAL LOW (ref 39.0–52.0)
Hemoglobin: 10.9 g/dL — ABNORMAL LOW (ref 13.0–17.0)
Hemoglobin: 11.6 g/dL — ABNORMAL LOW (ref 13.0–17.0)
MCH: 31.5 pg (ref 26.0–34.0)
MCH: 31.7 pg (ref 26.0–34.0)
MCHC: 33.7 g/dL (ref 30.0–36.0)
MCV: 93.5 fL (ref 78.0–100.0)
RBC: 3.44 MIL/uL — ABNORMAL LOW (ref 4.22–5.81)
RBC: 3.68 MIL/uL — ABNORMAL LOW (ref 4.22–5.81)
WBC: 6.3 10*3/uL (ref 4.0–10.5)

## 2011-07-13 LAB — BASIC METABOLIC PANEL
BUN: 24 mg/dL — ABNORMAL HIGH (ref 6–23)
CO2: 24 mEq/L (ref 19–32)
Calcium: 8.9 mg/dL (ref 8.4–10.5)
Chloride: 102 mEq/L (ref 96–112)
Creatinine, Ser: 1.05 mg/dL (ref 0.50–1.35)
GFR calc non Af Amer: 60 mL/min — ABNORMAL LOW (ref >90)
Glucose, Bld: 128 mg/dL — ABNORMAL HIGH (ref 70–99)
Glucose, Bld: 108 mg/dL — ABNORMAL HIGH (ref 70–99)
Potassium: 4.6 mEq/L (ref 3.5–5.1)
Potassium: 4.1 mEq/L (ref 3.5–5.1)
Sodium: 137 mEq/L (ref 135–145)

## 2011-07-13 MED ORDER — ACETAMINOPHEN 325 MG PO TABS: 650.0000 mg | ORAL_TABLET | Freq: Four times a day (QID) | ORAL | Status: DC | PRN

## 2011-07-13 MED ORDER — POLYETHYLENE GLYCOL 3350 17 G PO PACK: 17.0000 g | PACK | Freq: Every day | ORAL | Status: DC | PRN

## 2011-07-13 MED ORDER — SODIUM CHLORIDE 0.9 % IV SOLN
Freq: Once | INTRAVENOUS | Status: AC
  Administered 2011-07-13: 02:00:00 via INTRAVENOUS

## 2011-07-13 MED ORDER — OXYCODONE HCL 5 MG PO TABS: 5.0000 mg | ORAL_TABLET | ORAL | Status: DC | PRN

## 2011-07-13 MED ORDER — DOCUSATE SODIUM 100 MG PO CAPS
100.0000 mg | ORAL_CAPSULE | Freq: Two times a day (BID) | ORAL | Status: DC
  Administered 2011-07-13: 100 mg via ORAL
  Filled 2011-07-13: qty 1

## 2011-07-13 MED ORDER — CARBIDOPA-LEVODOPA 25-100 MG PO TABS
1.0000 | ORAL_TABLET | Freq: Four times a day (QID) | ORAL | Status: DC
  Administered 2011-07-13 – 2011-07-14 (×3): 1 via ORAL
  Filled 2011-07-13 (×3): qty 1

## 2011-07-13 MED ORDER — ACETAMINOPHEN 650 MG RE SUPP
650.0000 mg | Freq: Four times a day (QID) | RECTAL | Status: DC | PRN
  Administered 2011-07-13: 650 mg via RECTAL

## 2011-07-13 MED ORDER — MORPHINE SULFATE 2 MG/ML IJ SOLN
2.0000 mg | INTRAMUSCULAR | Status: DC
  Administered 2011-07-13 – 2011-07-14 (×8): 2 mg via INTRAVENOUS
  Filled 2011-07-13 (×8): qty 1

## 2011-07-13 MED ORDER — ENOXAPARIN SODIUM 40 MG/0.4ML ~~LOC~~ SOLN
40.0000 mg | SUBCUTANEOUS | Status: DC
  Administered 2011-07-13 – 2011-07-14 (×2): 40 mg via SUBCUTANEOUS
  Filled 2011-07-13: qty 0.4

## 2011-07-13 MED ORDER — ONDANSETRON HCL 4 MG/2ML IJ SOLN: 4.0000 mg | Freq: Four times a day (QID) | INTRAMUSCULAR | Status: DC | PRN

## 2011-07-13 MED ORDER — ESCITALOPRAM OXALATE 10 MG PO TABS
20.0000 mg | ORAL_TABLET | Freq: Every day | ORAL | Status: DC
  Administered 2011-07-14: 20 mg via ORAL
  Filled 2011-07-13: qty 2

## 2011-07-13 MED ORDER — DEXTROSE 5 % IV SOLN
1.0000 g | INTRAVENOUS | Status: DC
  Administered 2011-07-13: 1 g via INTRAVENOUS
  Filled 2011-07-13 (×2): qty 10

## 2011-07-13 MED ORDER — DEXTROSE 5 % IV SOLN
1.0000 g | Freq: Once | INTRAVENOUS | Status: AC
  Administered 2011-07-13: 02:00:00 via INTRAVENOUS
  Filled 2011-07-13: qty 10

## 2011-07-13 MED ORDER — MIDODRINE HCL 5 MG PO TABS
2.5000 mg | ORAL_TABLET | Freq: Three times a day (TID) | ORAL | Status: DC
  Administered 2011-07-13 – 2011-07-14 (×3): 2.5 mg via ORAL
  Filled 2011-07-13: qty 0.5
  Filled 2011-07-13: qty 1
  Filled 2011-07-13 (×5): qty 0.5

## 2011-07-13 MED ORDER — CLONAZEPAM 0.5 MG PO TABS
0.2500 mg | ORAL_TABLET | Freq: Every day | ORAL | Status: DC
  Administered 2011-07-13: 2.5 mg via ORAL
  Filled 2011-07-13: qty 1

## 2011-07-13 MED ORDER — TAMSULOSIN HCL 0.4 MG PO CAPS
0.4000 mg | ORAL_CAPSULE | Freq: Every day | ORAL | Status: DC
  Administered 2011-07-14: 0.4 mg via ORAL
  Filled 2011-07-13: qty 1

## 2011-07-13 MED ORDER — ASPIRIN EC 81 MG PO TBEC
81.0000 mg | DELAYED_RELEASE_TABLET | Freq: Every day | ORAL | Status: DC
  Administered 2011-07-14: 81 mg via ORAL
  Filled 2011-07-13: qty 1

## 2011-07-13 MED ORDER — ONDANSETRON HCL 4 MG PO TABS: 4.0000 mg | ORAL_TABLET | Freq: Four times a day (QID) | ORAL | Status: DC | PRN

## 2011-07-13 MED ORDER — ACETAMINOPHEN 160 MG/5ML PO SOLN
650.0000 mg | Freq: Four times a day (QID) | ORAL | Status: DC | PRN
  Administered 2011-07-13: 650 mg via ORAL
  Filled 2011-07-13: qty 20.3

## 2011-07-13 MED ORDER — ALUM & MAG HYDROXIDE-SIMETH 200-200-20 MG/5ML PO SUSP: 30.0000 mL | Freq: Four times a day (QID) | ORAL | Status: DC | PRN

## 2011-07-13 MED ORDER — PANTOPRAZOLE SODIUM 40 MG PO TBEC
40.0000 mg | DELAYED_RELEASE_TABLET | Freq: Every day | ORAL | Status: DC
  Administered 2011-07-14: 40 mg via ORAL
  Filled 2011-07-13: qty 1

## 2011-07-13 MED ORDER — VITAMIN D 1000 UNITS PO TABS
1000.0000 [IU] | ORAL_TABLET | Freq: Every day | ORAL | Status: DC
  Administered 2011-07-14: 1000 [IU] via ORAL
  Filled 2011-07-13: qty 1

## 2011-07-13 MED ORDER — ACETAMINOPHEN 650 MG RE SUPP
650.0000 mg | Freq: Once | RECTAL | Status: AC
  Administered 2011-07-13: 650 mg via RECTAL
  Filled 2011-07-13: qty 1

## 2011-07-13 MED ORDER — DUTASTERIDE 0.5 MG PO CAPS
0.5000 mg | ORAL_CAPSULE | Freq: Every day | ORAL | Status: DC
  Administered 2011-07-14: 0.5 mg via ORAL
  Filled 2011-07-13 (×3): qty 1

## 2011-07-13 MED ORDER — ACETAMINOPHEN 325 MG RE SUPP
RECTAL | Status: AC
  Filled 2011-07-13: qty 2

## 2011-07-13 MED ORDER — SODIUM CHLORIDE 0.9 % IV SOLN
INTRAVENOUS | Status: AC
  Administered 2011-07-13 (×2): via INTRAVENOUS

## 2011-07-13 MED ORDER — DOCUSATE SODIUM 100 MG PO CAPS: 100.0000 mg | ORAL_CAPSULE | Freq: Two times a day (BID) | ORAL | Status: DC

## 2011-07-13 NOTE — Clinical Social Work Note (Signed)
Pt assessed by hospice today and family request that pt return to [facility] with hospice services.  MD aware.  Possible d/c tomorrow.  [facility] notified and are agreeable.  CSW will follow up tomorrow.  Derenda Fennel, [place] [phone]

## 2011-07-13 NOTE — Progress Notes (Signed)
Because of poor medical prognosis, pt is not appropriate for PT.  Will d/c orders.

## 2011-07-13 NOTE — ED Provider Notes (Signed)
History   Level 5 Caveat - Unable to obtain complete HPI/ROS due to patient's .  This chart was scribed for Dione Booze, MD by Shari Heritage. The patient was seen in room APA01/APA01. Patient's care was started at 2216.     CSN: [id number]  Arrival date & time 07/12/11  2216   First MD Initiated Contact with Patient 07/13/11 0001      Chief Complaint  Patient presents with  . Weakness  . Fever    (Consider location/radiation/quality/duration/timing/severity/associated sxs/prior treatment) Patient is a 78 y.o. male presenting with weakness and fever. No language interpreter was used.  Weakness The primary symptoms include fever.  Additional symptoms include weakness.  Fever Primary symptoms of the febrile illness include fever.   Jeremiah Johnson is a 78 y.o. male who presents to the Emergency Department complaining of fever. Patient was sent to ED from nursing home [facility]) with decreased level of consciousness.  Past Medical History  Diagnosis Date  . Falls frequently   . Parkinson's disease   . Wrist contusion   . Dementia   . PTSD (post-traumatic stress disorder)   . COPD (chronic obstructive pulmonary disease)   . BPH (benign prostatic hyperplasia)   . CAD (coronary artery disease)   . Anxiety     Past Surgical History  Procedure Date  . None     No family history on file.  History  Substance Use Topics  . Smoking status: Never Smoker   . Smokeless tobacco: Not on file  . Alcohol Use: No      Review of Systems  Unable to perform ROS Constitutional: Positive for fever.  Neurological: Positive for weakness.    Allergies  Review of patient's allergies indicates no known allergies.  Home Medications   Current Outpatient Rx  Name Route Sig Dispense Refill  . ACETAMINOPHEN 650 MG RE SUPP Rectal Place 650 mg rectally as needed. For temperature over 101.2    . PRO-STAT 101 PO Oral Take 30 mLs by mouth daily. *Given at 2100 daily to support adequate  protein intake    . ASPIRIN EC 81 MG PO TBEC Oral Take 81 mg by mouth every morning. **DO NOT CRUSH**    . CARBIDOPA-LEVODOPA 25-100 MG PO TABS Oral Take 1 tablet by mouth 4 (four) times daily.    Marland Kitchen VITAMIN D 1000 UNITS PO TABS Oral Take 1,000 Units by mouth every morning.    Marland Kitchen CLONAZEPAM 0.5 MG PO TABS Oral Take 0.25 mg by mouth at bedtime. For PTSD/Anxiety    . CYANOCOBALAMIN 1000 MCG/ML IJ SOLN Intramuscular Inject 1,000 mcg into the muscle every 30 (thirty) days.    . DOCUSATE SODIUM 100 MG PO CAPS Oral Take 100 mg by mouth 2 (two) times daily. **HOLD FOR DIARRHEA**    . DUTASTERIDE 0.5 MG PO CAPS Oral Take 0.5 mg by mouth every morning. **DO NOT CRUSH**    . ESCITALOPRAM OXALATE 20 MG PO TABS Oral Take 20 mg by mouth every morning.    Marland Kitchen MAGNESIUM HYDROXIDE 400 MG/5ML PO SUSP Oral Take 30 mLs by mouth daily as needed.    Marland Kitchen MIDODRINE HCL 2.5 MG PO TABS Oral Take 2.5 mg by mouth 3 (three) times daily.    Marland Kitchen NUTRITIONAL SUPPLEMENT PO LIQD Oral Take by mouth 2 (two) times daily between meals. Health Shake    . OXYCODONE HCL 5 MG PO CAPS Oral Take 5 mg by mouth every 4 (four) hours as needed. Routinely every 4 hours  when awake for pain    . PANTOPRAZOLE SODIUM 40 MG PO TBEC Oral Take 40 mg by mouth daily. **DO NOT CRUSH** (for esophageal junction)    . TAMSULOSIN HCL 0.4 MG PO CAPS Oral Take 0.4 mg by mouth every morning. Swallow whole**DO NOT CRUSH**      BP 95/40  Pulse 92  Temp 101.8 F (38.8 C) (Rectal)  Resp 16  SpO2 95%  Physical Exam  Nursing note and vitals reviewed. Constitutional: He is oriented to person, place, and time.       Ill-appearing.  HENT:  Head: Normocephalic and atraumatic.  Eyes: Conjunctivae and EOM are normal. Pupils are equal, round, and reactive to light.  Cardiovascular: Normal rate and regular rhythm.   Pulmonary/Chest: Effort normal. He has rales (few at right base.).  Abdominal: Soft. Bowel sounds are normal.  Musculoskeletal: Normal range of motion. He  exhibits edema.       Flexion and contraction of legs. 2+ pedal edema.  Neurological: He is alert and oriented to person, place, and time.  Skin: Skin is warm and dry.  Psychiatric: He has a normal mood and affect.    ED Course  Procedures (including critical care time) DIAGNOSTIC STUDIES: Oxygen Saturation is 95% on room air, adequate by my interpretation.    COORDINATION OF CARE: 12:08AM - Performed physical exam and reviewed triage note. Patient with decreased level of consciousness.   Results for orders placed during the hospital encounter of 07/12/11  CBC      Component Value Range   WBC 11.2 (*) 4.0 - 10.5 K/uL   RBC 3.68 (*) 4.22 - 5.81 MIL/uL   Hemoglobin 11.6 (*) 13.0 - 17.0 g/dL   HCT 16.1 (*) 09.6 - 04.5 %   MCV 93.5  78.0 - 100.0 fL   MCH 31.5  26.0 - 34.0 pg   MCHC 33.7  30.0 - 36.0 g/dL   RDW 40.9  81.1 - 91.4 %   Platelets 102 (*) 150 - 400 K/uL  DIFFERENTIAL      Component Value Range   Neutrophils Relative 85 (*) 43 - 77 %   Neutro Abs 9.5 (*) 1.7 - 7.7 K/uL   Lymphocytes Relative 7 (*) 12 - 46 %   Lymphs Abs 0.7  0.7 - 4.0 K/uL   Monocytes Relative 8  3 - 12 %   Monocytes Absolute 0.9  0.1 - 1.0 K/uL   Eosinophils Relative 0  0 - 5 %   Eosinophils Absolute 0.0  0.0 - 0.7 K/uL   Basophils Relative 0  0 - 1 %   Basophils Absolute 0.0  0.0 - 0.1 K/uL  BASIC METABOLIC PANEL      Component Value Range   Sodium 138  135 - 145 mEq/L   Potassium 4.6  3.5 - 5.1 mEq/L   Chloride 102  96 - 112 mEq/L   CO2 26  19 - 32 mEq/L   Glucose, Bld 128 (*) 70 - 99 mg/dL   BUN 24 (*) 6 - 23 mg/dL   Creatinine, Ser 7.82  0.50 - 1.35 mg/dL   Calcium 9.1  8.4 - 95.6 mg/dL   GFR calc non Af Amer 60 (*) >90 mL/min   GFR calc Af Amer 70 (*) >90 mL/min  URINALYSIS, ROUTINE W REFLEX MICROSCOPIC      Component Value Range   Color, Urine BROWN (*) YELLOW   APPearance CLOUDY (*) CLEAR   Specific Gravity, Urine 1.010  1.005 - 1.030  pH 7.0  5.0 - 8.0   Glucose, UA NEGATIVE   NEGATIVE mg/dL   Hgb urine dipstick LARGE (*) NEGATIVE   Bilirubin Urine SMALL (*) NEGATIVE   Ketones, ur NEGATIVE  NEGATIVE mg/dL   Protein, ur 30 (*) NEGATIVE mg/dL   Urobilinogen, UA 0.2  0.0 - 1.0 mg/dL   Nitrite POSITIVE (*) NEGATIVE   Leukocytes, UA LARGE (*) NEGATIVE  CULTURE, BLOOD (ROUTINE X 2)      Component Value Range   Specimen Description BLOOD LEFT HAND     Special Requests BOTTLES DRAWN AEROBIC AND ANAEROBIC 4CC EACH     Culture PENDING     Report Status PENDING    CULTURE, BLOOD (ROUTINE X 2)      Component Value Range   Specimen Description BLOOD RIGHT HAND     Special Requests BOTTLES DRAWN AEROBIC AND ANAEROBIC 4CC EACH     Culture PENDING     Report Status PENDING    URINE MICROSCOPIC-ADD ON      Component Value Range   Squamous Epithelial / LPF RARE  RARE   WBC, UA TOO NUMEROUS TO COUNT  <3 WBC/hpf   RBC / HPF TOO NUMEROUS TO COUNT  <3 RBC/hpf   Bacteria, UA MANY (*) RARE   Dg Chest Portable 1 View  07/13/2011  *RADIOLOGY REPORT*  Clinical Data: Weakness and fever.  PORTABLE CHEST - 1 VIEW  Comparison: Chest radiograph performed 02/03/2011  Findings: The lungs are hypoexpanded, as on the prior study. Vascular crowding and vascular congestion are seen.  Bibasilar airspace opacities may reflect atelectasis or pneumonia.  A small left pleural effusion may be present.  The cardiomediastinal silhouette is borderline normal in size; calcification is noted within the aortic arch.  No acute osseous abnormalities are seen.  Scattered clips are seen overlying the gastroesophageal junction.  IMPRESSION: Lungs hypoexpanded; vascular congestion noted.  Bibasilar airspace opacities may reflect atelectasis or pneumonia.  Question of small left pleural effusion.  Original Report Authenticated By: Tonia Ghent, M.D.      1. Urinary tract infection       MDM  Fever with most likely causes pain urinary tract infection and pneumonia.  Urinalysis has come back with definite  evidence of UTI. Chest x-ray is equivocal for pneumonia. With normal oxygen saturations, I doubt significant pneumonia. He is placed on Rocephin for her urinary tract infection after urine culture was sent and case is been discussed with Dr. Phillips Odor [facility] who agrees to admit the patient.      I personally performed the services described in this documentation, which was scribed in my presence. The recorded information has been reviewed and considered.      Dione Booze, MD 07/28/11 [phone]

## 2011-07-13 NOTE — Clinical Social Work Note (Signed)
CSW confirmed with [facility] that they can accept pt back tomorrow with peripheral IV for antibiotics for 6 days by MD request. [facility] will consult hospice again after that as [facility] reports hospice cannot be involved while on IV antibiotics.   Derenda Fennel, [place] [phone]

## 2011-07-13 NOTE — ED Notes (Signed)
Phone [facility] to inform them of patient admission

## 2011-07-13 NOTE — Progress Notes (Signed)
Brief Note  Pt identified by nutrition screen r/t Dysphagia, Weight loss. End-Stage Parkinson's dz. This kind gentleman is known to RD from [facility]. Based on chart review goal of care is comfort. No nutrition rec at this time due to poor prognosis.  Dietitian [phone]

## 2011-07-13 NOTE — Clinical Social Work Psychosocial (Signed)
Clinical Social Work Department BRIEF PSYCHOSOCIAL ASSESSMENT 07/13/2011  Patient:  Jeremiah Johnson, Jeremiah Johnson     Account Number:  [id number]     Admit date:  07/12/2011  Clinical Social Worker:  Nancie Neas  Date/Time:  07/13/2011 11:15 AM  Referred by:  Physician  Date Referred:  07/13/2011 Referred for  Residential hospice placement   Other Referral:   Interview type:  Family Other interview type:   granddaughter- [name]    PSYCHOSOCIAL DATA Living Status:  FACILITY Admitted from facility:  [facility] Level of care:  Skilled Nursing Facility Primary support name:  Jeremiah Johnson Primary support relationship to patient:  FAMILY Degree of support available:   adequate- granddaughter lives out of town    CURRENT CONCERNS Current Concerns  Post-Acute Placement   Other Concerns:    SOCIAL WORK ASSESSMENT / PLAN CSW spoke with pt's granddaughter Jeremiah Johnson who lives out of town but is involved in pt's care.  Pt has been a longterm resident at [facility]  Per H&P, pt was expressing desire to be made comfort care yesterday and granddaughter was also agreeable.  CSW discussed hospice care with Jeremiah Johnson and meeting is set for this afternoon with hospice.  She is open to considering Hospice Home or return to [facility] with hospice.  Per Lynnea Ferrier at facility, okay for pt to return if needed.   Assessment/plan status:  Psychosocial Support/Ongoing Assessment of Needs Other assessment/ plan:   Information/referral to community resources:   [facility]    PATIENT'S/FAMILY'S RESPONSE TO PLAN OF CARE: Pt unable to discuss plan of care currently.  His granddaughter is open to hospice services and consult made. CSW will follow up after evaluation.  Possible d/c this afternoon.        Derenda Fennel, [place] [phone]

## 2011-07-13 NOTE — Progress Notes (Signed)
Lengthy conversation with his POA grandaughter Susy [place] regarding goals of care. Patient has been complaining of worsening pain, suffering-has expressed wish to die.  Summary: 1. Patient desires aggressive pain control and comfort care at the end of life-will schedule morphine IV every four hours for pain 2. Family understand prognosis is very poor-would like to move toward comfort care when grand-daughter gets here this evening or early tomorrow. 3. No feeding tubes, considering stopping IV antibiotics 4. Plan for Hospice Care at facility if he survives till discharge and full copmfort care without re-hospitalization.

## 2011-07-13 NOTE — Progress Notes (Signed)
Patient was able to take liquid tylenol very slowly. It was given 2 cc at a time, 1 cc on either side of the cheek. At this time I do not for see that he will be able to swallow his meds crushed in applesauce.

## 2011-07-13 NOTE — Progress Notes (Signed)
UR Chart Review Completed  

## 2011-07-13 NOTE — H&P (Signed)
[facility] History and Physical  PIERS BAADE AVW:[id number] DOB: 12-05-1918 DOA: 07/12/2011  Referring physician: EDP PCP: No primary provider on file.   Chief Complaint: Weakness, Febrile at Nursing Home  HPI:  Patient is a severely debilitated 78 year old gentleman who is a resident at [facility] who was brought to the emergency department with weakness and fever. The patient is unable to provide details about his medical condition and is very HOH. He complains of leg pain only-otherwise he cannot provide information. He is able to tell me he has a Engineer, maintenance (IT) in [place] who helps with medical decisions.  Review of Systems:  *unable to obtain from patient  Past Medical History  Diagnosis Date  . Falls frequently   . Parkinson's disease   . Wrist contusion   . Dementia   . PTSD (post-traumatic stress disorder)   . COPD (chronic obstructive pulmonary disease)   . BPH (benign prostatic hyperplasia)   . CAD (coronary artery disease)   . Anxiety    Past Surgical History  Procedure Date  . None    Social History:  reports that he has never smoked. He does not have any smokeless tobacco history on file. He reports that he does not drink alcohol or use illicit drugs.  No Known Allergies  History reviewed. No pertinent family history.  Prior to Admission medications   Medication Sig Start Date End Date Taking? Authorizing Provider  acetaminophen (TYLENOL) 650 MG suppository Place 650 mg rectally as needed. For temperature over 101.2   Yes Historical Provider, MD  Amino Acids-Protein Hydrolys (PRO-STAT 101 PO) Take 30 mLs by mouth daily. *Given at 2100 daily to support adequate protein intake   Yes Historical Provider, MD  aspirin EC 81 MG tablet Take 81 mg by mouth every morning. **DO NOT CRUSH**   Yes Historical Provider, MD  carbidopa-levodopa (SINEMET) 25-100 MG per tablet Take 1 tablet by mouth 4 (four) times daily.   Yes Historical Provider, MD    cholecalciferol (VITAMIN D) 1000 UNITS tablet Take 1,000 Units by mouth every morning.   Yes Historical Provider, MD  clonazePAM (KLONOPIN) 0.5 MG tablet Take 0.25 mg by mouth at bedtime. For PTSD/Anxiety   Yes Historical Provider, MD  cyanocobalamin (,VITAMIN B-12,) 1000 MCG/ML injection Inject 1,000 mcg into the muscle every 30 (thirty) days.   Yes Historical Provider, MD  docusate sodium (COLACE) 100 MG capsule Take 100 mg by mouth 2 (two) times daily. **HOLD FOR DIARRHEA**   Yes Historical Provider, MD  dutasteride (AVODART) 0.5 MG capsule Take 0.5 mg by mouth every morning. **DO NOT CRUSH**   Yes Historical Provider, MD  escitalopram (LEXAPRO) 20 MG tablet Take 20 mg by mouth every morning.   Yes Historical Provider, MD  magnesium hydroxide (MILK OF MAGNESIA) 400 MG/5ML suspension Take 30 mLs by mouth daily as needed.   Yes Historical Provider, MD  midodrine (PROAMATINE) 2.5 MG tablet Take 2.5 mg by mouth 3 (three) times daily.   Yes Historical Provider, MD  Nutritional Supplement LIQD Take by mouth 2 (two) times daily between meals. Health Shake   Yes Historical Provider, MD  oxycodone (OXY-IR) 5 MG capsule Take 5 mg by mouth every 4 (four) hours as needed. Routinely every 4 hours when awake for pain   Yes Historical Provider, MD  pantoprazole (PROTONIX) 40 MG tablet Take 40 mg by mouth daily. **DO NOT CRUSH** (for esophageal junction)   Yes Historical Provider, MD  Tamsulosin HCl (FLOMAX) 0.4 MG CAPS Take 0.4  mg by mouth every morning. Swallow whole**DO NOT CRUSH**   Yes Historical Provider, MD   Physical Exam: Filed Vitals:   07/13/11 0300 07/13/11 0324 07/13/11 0356 07/13/11 0417  BP: 140/84 162/81 116/71   Pulse:  91 74   Temp:   102.2 F (39 C)   TempSrc:   Rectal   Resp:  18 19   Height:    5\' 8"  (1.727 m)  Weight:   65.953 kg (145 lb 6.4 oz)   SpO2:  96% 95%      General:  Frail, pale, debilitated elderly gentleman**  Eyes: anicteric, tear duct exudates, crusting, lid  lag  ENT: mucous mebranes dry  Neck: supple, no adenopathy  Cardiovascular: RRR  Respiratory: Course scattered rhonchi  Abdomen: soft, non-tender  Skin: no rashes or bruising  Musculoskeletal: severley contracted LE  Psychiatric: Oriented to person not place and time  Neurologic: confused, lethargic no obvious focal problems.  Labs on Admission:  Basic Metabolic Panel:  Lab 07/13/11 1308  NA 138  K 4.6  CL 102  CO2 26  GLUCOSE 128*  BUN 24*  CREATININE 1.05  CALCIUM 9.1  MG --  PHOS --   Liver Function Tests: No results found for this basename: AST:5,ALT:5,ALKPHOS:5,BILITOT:5,PROT:5,ALBUMIN:5 in the last 168 hours No results found for this basename: LIPASE:5,AMYLASE:5 in the last 168 hours No results found for this basename: AMMONIA:5 in the last 168 hours CBC:  Lab 07/13/11 0003  WBC 11.2*  NEUTROABS 9.5*  HGB 11.6*  HCT 34.4*  MCV 93.5  PLT 102*    Radiological Exams on Admission: Dg Chest Portable 1 View  07/13/2011  *RADIOLOGY REPORT*  Clinical Data: Weakness and fever.  PORTABLE CHEST - 1 VIEW  Comparison: Chest radiograph performed 02/03/2011  Findings: The lungs are hypoexpanded, as on the prior study. Vascular crowding and vascular congestion are seen.  Bibasilar airspace opacities may reflect atelectasis or pneumonia.  A small left pleural effusion may be present.  The cardiomediastinal silhouette is borderline normal in size; calcification is noted within the aortic arch.  No acute osseous abnormalities are seen.  Scattered clips are seen overlying the gastroesophageal junction.  IMPRESSION: Lungs hypoexpanded; vascular congestion noted.  Bibasilar airspace opacities may reflect atelectasis or pneumonia.  Question of small left pleural effusion.  Original Report Authenticated By: Tonia Ghent, M.D.    Assessment/Plan Principal Problem:  *Sepsis Active Problems:  UTI (lower urinary tract infection)  Thrombocytopenia  Adult failure to  thrive  1. Sepsis, likely urinary tract source Febrile to 102, tachycardic, elevated WBC, TNC WBC on UA Rocephin IV Await Cultures Cardiac status is stable SBP in 160's  2. Adult Failure to Thrive Because he is so lethargic and acutely ill he is not taking PO. Needs goals of care conversation with POA about continuation of abx, future aggressive care, IV fluid or the alternative of allowing a natural death to occur given his age and 72- he may actually be Hospice appropriate at this point under general debility.   Code Status: DNR, Gold Form Family Communication: Family unavailable Disposition Plan:  Return to SNF, possibly with Hospice if that is in line with family GOC.  Anderson Malta, MD  [facility] Pager [phone]  If 7PM-7AM, please contact night-coverage [email or web address] Password [facility] 07/13/2011, 5:53 AM

## 2011-07-13 NOTE — Progress Notes (Signed)
The patient is a 78 year old man with end-stage Parkinson's disease who was admitted this morning for urinary tract infection with sepsis. The patient was discussed with admitting physician, Dr. Phillips Odor. Accordingly, the patient's grandaughter, Ms. Ramey desires aggressive pain control and comfort care as his quality of life is very poor. The patient was briefly seen. His chart, vital signs, and laboratory studies were reviewed. Will consult hospice. Will continue current treatments, except will decrease the IV fluids. Will await his granddaughter's arrival today for further discussion.. His prognosis is very poor.

## 2011-07-14 ENCOUNTER — Inpatient Hospital Stay
Admission: RE | Admit: 2011-07-14 | Discharge: 2015-03-25 | Disposition: E | Payer: PRIVATE HEALTH INSURANCE | Source: Ambulatory Visit | Attending: Internal Medicine | Admitting: Internal Medicine

## 2011-07-14 DIAGNOSIS — R1310 Dysphagia, unspecified: Principal | ICD-10-CM

## 2011-07-14 DIAGNOSIS — R627 Adult failure to thrive: Secondary | ICD-10-CM

## 2011-07-14 DIAGNOSIS — E782 Mixed hyperlipidemia: Secondary | ICD-10-CM

## 2011-07-14 DIAGNOSIS — A413 Sepsis due to Hemophilus influenzae: Secondary | ICD-10-CM

## 2011-07-14 DIAGNOSIS — M311 Thrombotic microangiopathy: Secondary | ICD-10-CM

## 2011-07-14 MED ORDER — OXYCODONE HCL 5 MG/5ML PO SOLN: 5.0000 mg | Freq: Four times a day (QID) | ORAL | Status: DC

## 2011-07-14 MED ORDER — DIPHENHYDRAMINE HCL 50 MG/ML IJ SOLN
INTRAMUSCULAR | Status: AC
  Administered 2011-07-14: 25 mg via INTRAVENOUS
  Filled 2011-07-14: qty 1

## 2011-07-14 MED ORDER — ESCITALOPRAM OXALATE 20 MG PO TABS: 10.0000 mg | ORAL_TABLET | ORAL | Status: DC

## 2011-07-14 MED ORDER — SODIUM CHLORIDE 0.9 % IJ SOLN
INTRAMUSCULAR | Status: AC
  Administered 2011-07-14: 10 mL
  Filled 2011-07-14: qty 3

## 2011-07-14 MED ORDER — DIPHENHYDRAMINE HCL 50 MG/ML IJ SOLN
25.0000 mg | Freq: Once | INTRAMUSCULAR | Status: AC
  Administered 2011-07-14: 25 mg via INTRAVENOUS

## 2011-07-14 MED ORDER — DEXTROSE 5 % IV SOLN: 1.0000 g | INTRAVENOUS | Status: DC

## 2011-07-14 NOTE — Discharge Summary (Signed)
Physician Discharge Summary  Jeremiah Johnson MRN: [id number] DOB/AGE: October 02, 1918 78 y.o.  PCP: No primary provider on file.   Admit date: 07/12/2011 Discharge date: 07/28/2011  Discharge Diagnoses:  1. Transitioned to comfort care/hospice care following the completion of IV antibiotics. [facility] will need to be consulted at the skilled nursing facility. 2. Failure to thrive with poor quality of life. 3. Sepsis, secondary to Proteus urinary tract infection. 4. Bradycardia. 5. Thrombocytopenia secondary to acute sepsis. 6. Chronic anemia, likely secondary to chronic disease. 7. Mild dehydration. 8. Bibasilar airspace opacities, likely atelectasis.   Medication List  As of 28-Jul-2011 10:20 AM   STOP taking these medications         cholecalciferol 1000 UNITS tablet      cyanocobalamin 1000 MCG/ML injection      midodrine 2.5 MG tablet      oxycodone 5 MG capsule         TAKE these medications         acetaminophen 650 MG suppository   Commonly known as: TYLENOL   Place 650 mg rectally as needed. For temperature over 101.2      aspirin EC 81 MG tablet   Take 81 mg by mouth every morning. **DO NOT CRUSH**      carbidopa-levodopa 25-100 MG per tablet   Commonly known as: SINEMET IR   Take 1 tablet by mouth 4 (four) times daily.      clonazePAM 0.5 MG tablet   Commonly known as: KLONOPIN   Take 0.25 mg by mouth at bedtime. For PTSD/Anxiety      dextrose 5 % SOLN 50 mL with cefTRIAXone 1 G SOLR 1 g   Inject 1 g into the vein daily. For 6 more days.      docusate sodium 100 MG capsule   Commonly known as: COLACE   Take 100 mg by mouth 2 (two) times daily. **HOLD FOR DIARRHEA**      dutasteride 0.5 MG capsule   Commonly known as: AVODART   Take 0.5 mg by mouth every morning. **DO NOT CRUSH**      escitalopram 20 MG tablet   Commonly known as: LEXAPRO   Take 0.5 tablets (10 mg total) by mouth every morning.      magnesium hydroxide 400  MG/5ML suspension   Commonly known as: MILK OF MAGNESIA   Take 30 mLs by mouth daily as needed.      Nutritional Supplement Liqd   Take by mouth 2 (two) times daily between meals. Health Shake      oxyCODONE 5 MG/5ML solution   Commonly known as: ROXICODONE   Take 5 mLs (5 mg total) by mouth every 6 (six) hours.      pantoprazole 40 MG tablet   Commonly known as: PROTONIX   Take 40 mg by mouth daily. **DO NOT CRUSH** (for esophageal junction)      PRO-STAT 101 PO   Take 30 mLs by mouth daily. *Given at 2100 daily to support adequate protein intake      Tamsulosin HCl 0.4 MG Caps   Commonly known as: FLOMAX   Take 0.4 mg by mouth every morning. Swallow whole**DO NOT CRUSH**            Discharge Condition: Stable  Disposition: Skilled nursing facility.      Significant Diagnostic Studies: Dg Chest Portable 1 View  07/13/2011  *RADIOLOGY REPORT*  Clinical Data: Weakness and fever.  PORTABLE CHEST - 1 VIEW  Comparison: Chest radiograph performed 02/03/2011  Findings: The lungs are hypoexpanded, as on the prior study. Vascular crowding and vascular congestion are seen.  Bibasilar airspace opacities may reflect atelectasis or pneumonia.  A small left pleural effusion may be present.  The cardiomediastinal silhouette is borderline normal in size; calcification is noted within the aortic arch.  No acute osseous abnormalities are seen.  Scattered clips are seen overlying the gastroesophageal junction.  IMPRESSION: Lungs hypoexpanded; vascular congestion noted.  Bibasilar airspace opacities may reflect atelectasis or pneumonia.  Question of small left pleural effusion.  Original Report Authenticated By: Tonia Ghent, M.D.     Microbiology: Recent Results (from the past 240 hour(s))  CULTURE, BLOOD (ROUTINE X 2)     Status: Normal (Preliminary result)   Collection Time   07/13/11 12:19 AM      Component Value Range Status Comment   Specimen Description BLOOD LEFT HAND   Final     Special Requests BOTTLES DRAWN AEROBIC AND ANAEROBIC 4CC EACH   Final    Culture NO GROWTH 1 DAY   Final    Report Status PENDING   Incomplete   CULTURE, BLOOD (ROUTINE X 2)     Status: Normal (Preliminary result)   Collection Time   07/13/11 12:19 AM      Component Value Range Status Comment   Specimen Description BLOOD RIGHT HAND   Final    Special Requests BOTTLES DRAWN AEROBIC AND ANAEROBIC 4CC EACH   Final    Culture NO GROWTH 1 DAY   Final    Report Status PENDING   Incomplete   URINE CULTURE     Status: Normal (Preliminary result)   Collection Time   07/13/11 12:44 AM      Component Value Range Status Comment   Specimen Description URINE, CATHETERIZED   Final    Special Requests NONE   Final    Culture  Setup Time [id number]   Final    Colony Count >=100,000 COLONIES/ML   Final    Culture PROTEUS MIRABILIS   Final    Report Status PENDING   Incomplete      Labs: Results for orders placed during the hospital encounter of 07/12/11 (from the past 48 hour(s))  CBC     Status: Abnormal   Collection Time   07/13/11 12:03 AM      Component Value Range Comment   WBC 11.2 (*) 4.0 - 10.5 K/uL    RBC 3.68 (*) 4.22 - 5.81 MIL/uL    Hemoglobin 11.6 (*) 13.0 - 17.0 g/dL    HCT 95.6 (*) 21.3 - 52.0 %    MCV 93.5  78.0 - 100.0 fL    MCH 31.5  26.0 - 34.0 pg    MCHC 33.7  30.0 - 36.0 g/dL    RDW 08.6  57.8 - 46.9 %    Platelets 102 (*) 150 - 400 K/uL   DIFFERENTIAL     Status: Abnormal   Collection Time   07/13/11 12:03 AM      Component Value Range Comment   Neutrophils Relative 85 (*) 43 - 77 %    Neutro Abs 9.5 (*) 1.7 - 7.7 K/uL    Lymphocytes Relative 7 (*) 12 - 46 %    Lymphs Abs 0.7  0.7 - 4.0 K/uL    Monocytes Relative 8  3 - 12 %    Monocytes Absolute 0.9  0.1 - 1.0 K/uL    Eosinophils Relative 0  0 - 5 %  Eosinophils Absolute 0.0  0.0 - 0.7 K/uL    Basophils Relative 0  0 - 1 %    Basophils Absolute 0.0  0.0 - 0.1 K/uL   BASIC METABOLIC PANEL     Status: Abnormal     Collection Time   07/13/11 12:03 AM      Component Value Range Comment   Sodium 138  135 - 145 mEq/L    Potassium 4.6  3.5 - 5.1 mEq/L    Chloride 102  96 - 112 mEq/L    CO2 26  19 - 32 mEq/L    Glucose, Bld 128 (*) 70 - 99 mg/dL    BUN 24 (*) 6 - 23 mg/dL    Creatinine, Ser 2.95  0.50 - 1.35 mg/dL    Calcium 9.1  8.4 - 62.1 mg/dL    GFR calc non Af Amer 60 (*) >90 mL/min    GFR calc Af Amer 70 (*) >90 mL/min   CULTURE, BLOOD (ROUTINE X 2)     Status: Normal (Preliminary result)   Collection Time   07/13/11 12:19 AM      Component Value Range Comment   Specimen Description BLOOD LEFT HAND      Special Requests BOTTLES DRAWN AEROBIC AND ANAEROBIC 4CC EACH      Culture NO GROWTH 1 DAY      Report Status PENDING     CULTURE, BLOOD (ROUTINE X 2)     Status: Normal (Preliminary result)   Collection Time   07/13/11 12:19 AM      Component Value Range Comment   Specimen Description BLOOD RIGHT HAND      Special Requests BOTTLES DRAWN AEROBIC AND ANAEROBIC 4CC EACH      Culture NO GROWTH 1 DAY      Report Status PENDING     URINALYSIS, ROUTINE W REFLEX MICROSCOPIC     Status: Abnormal   Collection Time   07/13/11 12:44 AM      Component Value Range Comment   Color, Urine BROWN (*) YELLOW BIOCHEMICALS MAY BE AFFECTED BY COLOR   APPearance CLOUDY (*) CLEAR    Specific Gravity, Urine 1.010  1.005 - 1.030    pH 7.0  5.0 - 8.0    Glucose, UA NEGATIVE  NEGATIVE mg/dL    Hgb urine dipstick LARGE (*) NEGATIVE    Bilirubin Urine SMALL (*) NEGATIVE    Ketones, ur NEGATIVE  NEGATIVE mg/dL    Protein, ur 30 (*) NEGATIVE mg/dL    Urobilinogen, UA 0.2  0.0 - 1.0 mg/dL    Nitrite POSITIVE (*) NEGATIVE    Leukocytes, UA LARGE (*) NEGATIVE   URINE MICROSCOPIC-ADD ON     Status: Abnormal   Collection Time   07/13/11 12:44 AM      Component Value Range Comment   Squamous Epithelial / LPF RARE  RARE    WBC, UA TOO NUMEROUS TO COUNT  <3 WBC/hpf    RBC / HPF TOO NUMEROUS TO COUNT  <3 RBC/hpf     Bacteria, UA MANY (*) RARE   URINE CULTURE     Status: Normal (Preliminary result)   Collection Time   07/13/11 12:44 AM      Component Value Range Comment   Specimen Description URINE, CATHETERIZED      Special Requests NONE      Culture  Setup Time [id number]      Colony Count >=100,000 COLONIES/ML      Culture PROTEUS MIRABILIS  Report Status PENDING     CBC     Status: Abnormal   Collection Time   07/13/11  4:26 AM      Component Value Range Comment   WBC 6.3  4.0 - 10.5 K/uL    RBC 3.44 (*) 4.22 - 5.81 MIL/uL    Hemoglobin 10.9 (*) 13.0 - 17.0 g/dL    HCT 16.1 (*) 09.6 - 52.0 %    MCV 94.5  78.0 - 100.0 fL    MCH 31.7  26.0 - 34.0 pg    MCHC 33.5  30.0 - 36.0 g/dL    RDW 04.5  40.9 - 81.1 %    Platelets 83 (*) 150 - 400 K/uL   BASIC METABOLIC PANEL     Status: Abnormal   Collection Time   07/13/11  4:26 AM      Component Value Range Comment   Sodium 137  135 - 145 mEq/L    Potassium 4.1  3.5 - 5.1 mEq/L    Chloride 102  96 - 112 mEq/L    CO2 24  19 - 32 mEq/L    Glucose, Bld 108 (*) 70 - 99 mg/dL    BUN 24 (*) 6 - 23 mg/dL    Creatinine, Ser 9.14  0.50 - 1.35 mg/dL    Calcium 8.9  8.4 - 78.2 mg/dL    GFR calc non Af Amer 60 (*) >90 mL/min    GFR calc Af Amer 70 (*) >90 mL/min      HPI : The patient is a 78 year old man with a history significant for end-stage Parkinson's disease, dementia, COPD, and coronary artery disease, who presented to the emergency department from the [facility] on 07/13/2011 with weakness and fever. In the emergency department, his temperature is 102.25F. His lab data were significant for glucose of 128, BUN of 24, WBC of 11.2, hemoglobin of 11.6, and a platelet count of 102. His chest x-ray revealed hypoinflation, vascular congestion, and bibasilar airspace opacities which may reflect atelectasis or pneumonia. His urinalysis revealed brown urine, positive nitrites, too numerous to count WBCs and too numerous to count RBCs. He was admitted for  further evaluation and management.  HOSPITAL COURSE: The patient was started on IV fluid hydration. Blood cultures and a urine culture ordered in the emergency department. He was subsequently started on Rocephin for treatment of an urinary tract infection associated with sepsis. Admitting physician, Dr. Phillips Odor, discussed the patient's poor prognosis with his granddaughter/POA Ms. Laurine Blazer. According to Ms. Ramey, the patient had been complaining of worsening pain and expressed a wish to die. Following this conversation, it was decided that the patient would be continued on IV antibiotics for a total of one week, but IV fluids would be tapered off. Scheduled IV morphine was administered every 4 hours. The hospice representative was notified of the POA's desire for the patient to be a hospice patient, but she wanted the patient to stay at the [facility]. Therefore, following the completion of IV fluids, hospice will be consulted from the skilled nursing facility.   The patient was treated supportively. He became a little more alert. He was able to take small bites of pured food and particularly ice cream which she was very fond of. He was bradycardic, but his blood pressure had improved. He continued to be thrombocytopenic and anemic but not severe enough that would require transfusion even if it was appropriate. The urine culture grew out Proteus mirabilis, but the sensitivities were pending at  the time of discharge. The blood cultures were pending at the time of hospital discharge but remained negative to date. We'll continue IV Rocephin for 6 more days and then discontinue it. Hospice should be called for comfort care management at the [facility] and hopefully management that would not require rehospitalization. This is desire of Ms. Ramey. He was discharged on scheduled dosing of liquid Roxicodone every 6 hours. IV morphine was discontinued upon discharge. Further management will be deferred to the  patient's primary care physician and hospice.   Discharge Exam:  Blood pressure 103/57, pulse 48, temperature 100 F (37.8 C), temperature source Oral, resp. rate 17, height 5\' 8"  (1.727 m), weight 65.953 kg (145 lb 6.4 oz), SpO2 98.00%.  Lungs: Few crackles in the bases, breathing nonlabored. Heart: S1, S2, with a soft systolic murmur and bradycardia. Abdomen: Positive bowel sounds, soft, nontender, nondistended. Extremities: No pedal edema. Neurologic: He is alert. He denies pain.   Discharge Orders    Future Orders Please Complete By Expires   Increase activity slowly      Discharge instructions      Comments:   Resume previous diet from the nursing facility or pured with thin or thickened liquids.  The patient will be transitioned to comfort care/hospice care following the discontinuation of the IV antibiotics.     Total discharge: 30 minutes.   Signed: Branon Sabine 07-28-2011, 10:20 AM

## 2011-07-14 NOTE — Clinical Social Work Note (Signed)
Pt d/c today by MD to [facility].  Family requested return and also would like for pt to complete 6 days of IV antibiotics which SNF is agreeable to.  Hospice will be reconsulted after completion.  Pt to transfer with RN.  D/C summary faxed.  Derenda Fennel, [place] [phone]

## 2011-07-14 NOTE — Progress Notes (Signed)
Patient  Report called to [facility]. Patient discharged to SNF in stable condition

## 2011-07-15 LAB — URINE CULTURE: Colony Count: >=100000

## 2011-07-18 LAB — CULTURE, BLOOD (ROUTINE X 2)
Culture: NO GROWTH
Culture: NO GROWTH

## 2011-07-25 DEATH — deceased

## 2011-08-04 ENCOUNTER — Ambulatory Visit (HOSPITAL_COMMUNITY)
Admission: RE | Admit: 2011-08-04 | Discharge: 2011-08-04 | Disposition: A | Discharge status: Home / Self Care | Payer: Medicare Other | Source: Ambulatory Visit | Attending: Internal Medicine | Admitting: Internal Medicine

## 2011-08-04 DIAGNOSIS — IMO0001 Reserved for inherently not codable concepts without codable children: Secondary | ICD-10-CM | POA: Insufficient documentation

## 2011-08-04 DIAGNOSIS — R1310 Dysphagia, unspecified: Secondary | ICD-10-CM | POA: Insufficient documentation

## 2011-08-04 NOTE — Procedures (Signed)
Objective Swallowing Evaluation: Modified Barium Swallowing Study/Outpatient Patient Details  Name: Jeremiah Johnson MRN: [id number] Date of Birth: 05-02-1918  Today's Date: 08/04/2011 Time:  - 1608    Past Medical History:  Past Medical History  Diagnosis Date  . Falls frequently   . Parkinson's disease   . Wrist contusion   . Dementia   . PTSD (post-traumatic stress disorder)   . COPD (chronic obstructive pulmonary disease)   . BPH (benign prostatic hyperplasia)   . CAD (coronary artery disease)   . Anxiety    Past Surgical History:  Past Surgical History  Procedure Date  . None    HPI:  Jeremiah Johnson is a 78 yo resident at [facility] who was referred by Dr. Leanord Hawking for MBSS. He was recently an inpatient at [facility]. He has been consuming a mechanical soft diet with pureed meats and nectar-thick liquids.   Symptoms/Limitations Symptoms: "He is on a mechanical soft diet with pureed meats and nectar-thick liquids." Special Tests: MBSS  Recommendation/Prognosis  Clinical Impression Dysphagia Diagnosis: Mild oral phase dysphagia;Mild pharyngeal phase dysphagia Clinical impression: Overall mild oropharyngeal phase dysphagia characterized by prolonged oral prep with solids and pill with decreased AP transit (pill), premature spillage to pyriforms with thins with aspiration during the swallow with thins and flash penetration with nectars. Pt was sensate to one episode of aspiration and not to another. He does best with small sips and did tolerate teaspoon presentation thin liquids. Recommend D3/mech soft with nectar-thick liquids. Pt may desire teaspoon presentations thin water only for comfort.  Swallow Evaluation Recommendations Diet Recommendations: Dysphagia 3 (Mechanical Soft);Nectar-thick liquid Liquid Administration via: No straw;Cup Medication Administration: Crushed with puree Supervision: Full supervision/cueing for compensatory strategies Compensations: Slow rate;Small  sips/bites;Multiple dry swallows after each bite/sip Postural Changes and/or Swallow Maneuvers: Out of bed for meals;Seated upright 90 degrees;Upright 30-60 min after meal Oral Care Recommendations: Oral care BID;Staff/trained caregiver to provide oral care Other Recommendations: Clarify dietary restrictions Follow up Recommendations: Skilled Nursing facility Prognosis Prognosis for Safe Diet Advancement: Fair Barriers to Reach Goals: Cognitive deficits Individuals Consulted Consulted and Agree with Results and Recommendations: Patient;Other (Comment) (SLP)  SLP Assessment/Plan Dysphagia Diagnosis: Mild oral phase dysphagia;Mild pharyngeal phase dysphagia Clinical impression: Overall mild oropharyngeal phase dysphagia characterized by prolonged oral prep with solids and pill with decreased AP transit (pill), premature spillage to pyriforms with thins with aspiration during the swallow with thins and flash penetration with nectars. Pt was sensate to one episode of aspiration and not to another. He does best with small sips and did tolerate teaspoon presentation thin liquids. Recommend D3/mech soft with nectar-thick liquids. Pt may desire teaspoon presentations thin water only for comfort.   General:  Date of Onset: 07/12/11 HPI: Jeremiah Johnson is a 78 yo resident at [facility] who was referred by Dr. Leanord Hawking for MBSS. He was recently an inpatient at [facility]. He has been consuming a mechanical soft diet with pureed meats and nectar-thick liquids.  Type of Study: Modified Barium Swallowing Study Reason for Referral: Objectively evaluate swallowing function Diet Prior to this Study: Dysphagia 3 (soft);Nectar-thick liquids (pureed meats) Temperature Spikes Noted: No Respiratory Status: Room air History of Recent Intubation: No Behavior/Cognition: Alert;Cooperative;Requires cueing Oral Cavity - Dentition: Dentures, top;Dentures, bottom Oral Motor / Sensory Function: Within functional limits Self-Feeding  Abilities: Able to feed self;Needs assist Patient Positioning: Upright in chair Baseline Vocal Quality: Clear Volitional Cough: Strong Volitional Swallow: Able to elicit Anatomy: Within functional limits Pharyngeal Secretions: Not observed secondary MBS  Reason for Referral:  Objectively evaluate swallowing function   Oral Phase Oral Preparation/Oral Phase Oral Phase: Impaired Oral - Nectar Oral - Nectar Cup: Lingual/palatal residue Oral - Thin Oral - Thin Straw: Lingual/palatal residue (premature spillage) Oral - Solids Oral - Regular: Weak lingual manipulation;Lingual/palatal residue;Piecemeal swallowing (min residuals which he clears independently) Oral - Pill: Reduced posterior propulsion;Holding of bolus;Piecemeal swallowing;Delayed oral transit  Pharyngeal Phase  Pharyngeal Phase Pharyngeal Phase: Impaired Pharyngeal - Nectar Pharyngeal - Nectar Cup: Premature spillage to valleculae;Premature spillage to pyriform sinuses;Delayed swallow initiation;Penetration/Aspiration during swallow;Pharyngeal residue - valleculae Penetration/Aspiration details (nectar cup): Material does not enter airway;Material enters airway, remains ABOVE vocal cords then ejected out Pharyngeal - Nectar Straw: Premature spillage to pyriform sinuses;Delayed swallow initiation;Premature spillage to valleculae;Penetration/Aspiration during swallow;Pharyngeal residue - valleculae Penetration/Aspiration details (nectar straw): Material does not enter airway;Material enters airway, remains ABOVE vocal cords then ejected out Pharyngeal - Thin Pharyngeal - Thin Cup: Delayed swallow initiation;Premature spillage to pyriform sinuses;Reduced airway/laryngeal closure;Penetration/Aspiration during swallow;Moderate aspiration Penetration/Aspiration details (thin cup): Material enters airway, remains ABOVE vocal cords then ejected out;Material enters airway, passes BELOW cords and not ejected out despite cough attempt by  patient Pharyngeal - Thin Straw: Delayed swallow initiation;Premature spillage to pyriform sinuses;Penetration/Aspiration during swallow;Moderate aspiration Penetration/Aspiration details (thin straw): Material enters airway, remains ABOVE vocal cords then ejected out;Material enters airway, remains ABOVE vocal cords and not ejected out;Material enters airway, passes BELOW cords without attempt by patient to eject out (silent aspiration)  Cervical Esophageal Phase  Cervical Esophageal Phase Cervical Esophageal Phase: [facility] (limited view due to short stature of pt)  Thank you,  Havery Moros, CCC-SLP [phone]  Jeremiah Johnson 08/04/2011, 5:35 PM

## 2011-08-14 IMAGING — RF DG ESOPHAGUS
14 of 20 series · 14 of 20 positions shown · non-contrast
Comparison: Barium swallow 09/04/2008

CLINICAL DATA: Dysphagia, longstanding, Parkinson's disease

ESOPHOGRAM/BARIUM SWALLOW
TECHNIQUE: Single contrast examination was performed using thin
barium.
Fluoroscopy time:  3.5 minutes.

[Series 1: run · 1 of 1 slices shown (1 of 14)]
[im 1/1]
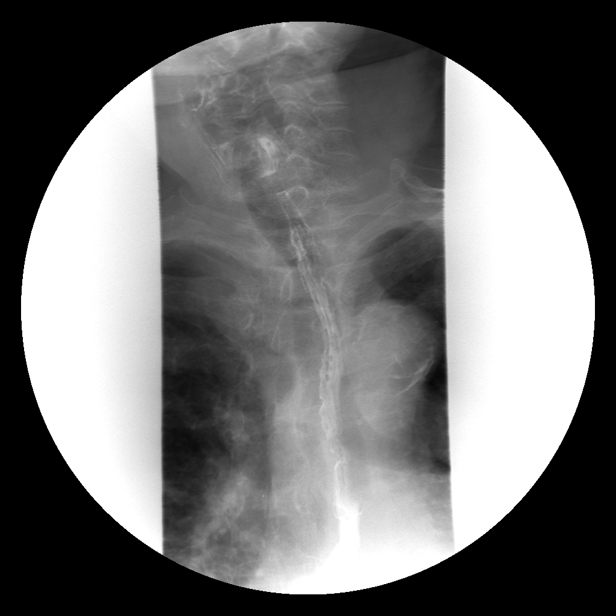

[Series 3: run · 1 of 1 slices shown (2 of 14)]
[im 1/1]
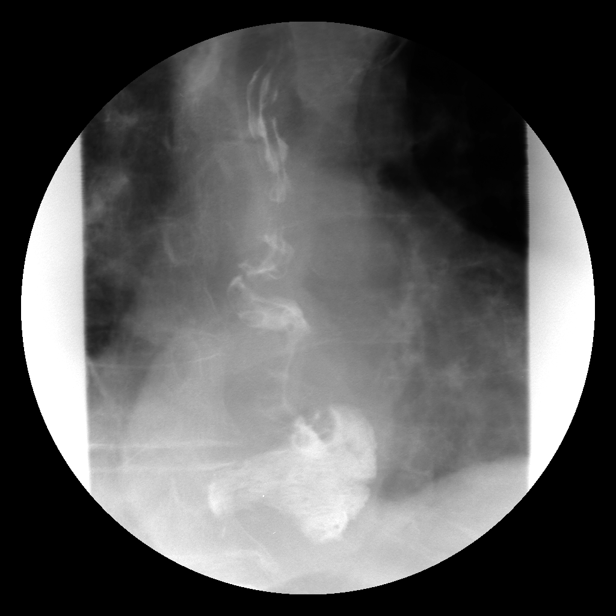

[Series 4: run · 1 of 1 slices shown (3 of 14)]
[im 1/1]
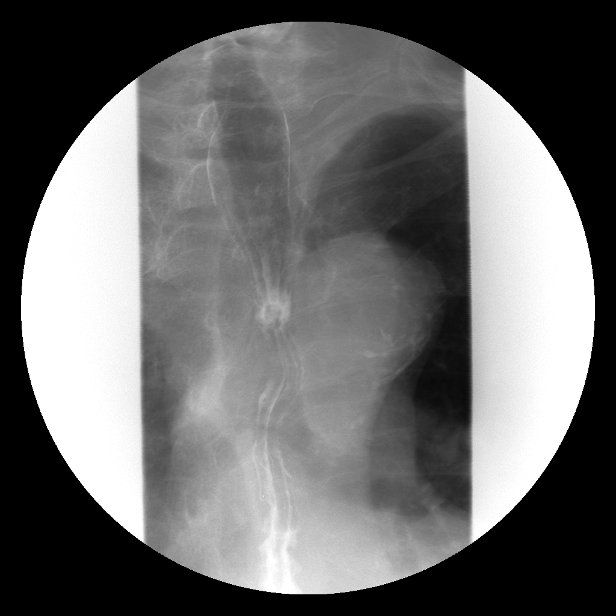

[Series 6: run · 1 of 1 slices shown (4 of 14)]
[im 1/1]
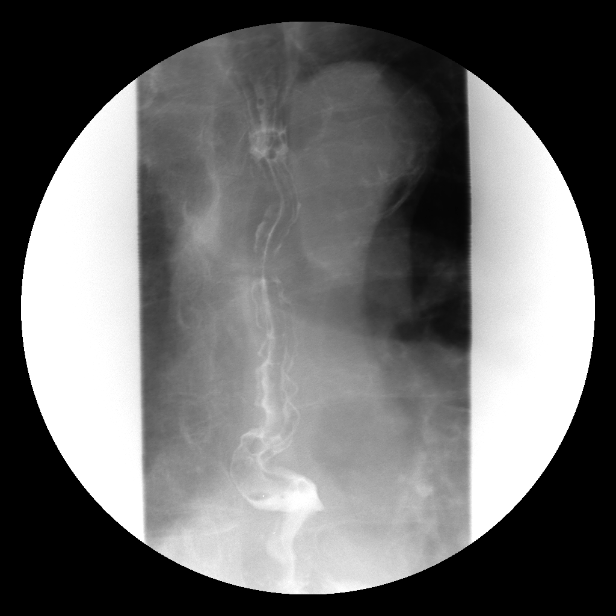

[Series 7: run · 1 of 1 slices shown (5 of 14)]
[im 1/1]
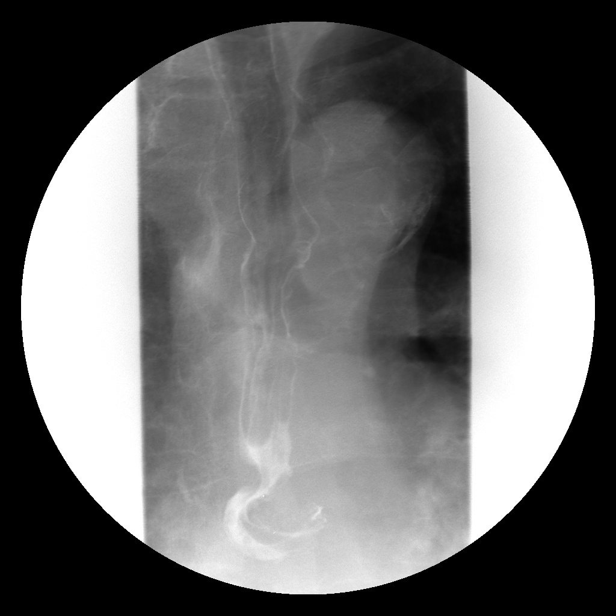

[Series 8: run · 1 of 1 slices shown (6 of 14)]
[im 1/1]
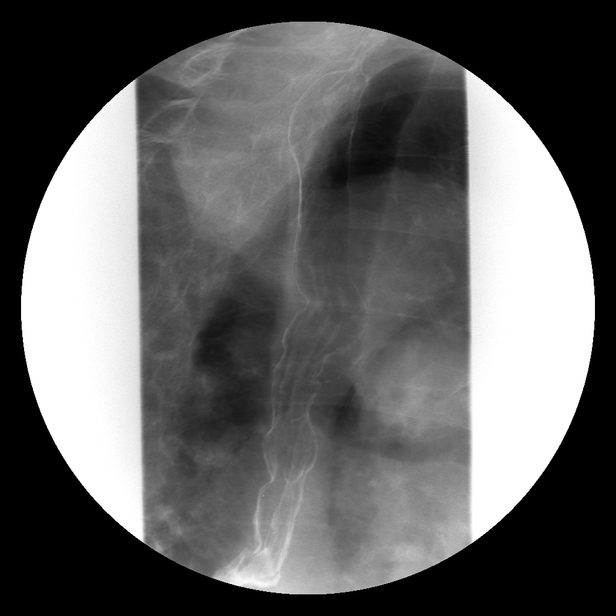

[Series 10: run · 1 of 1 slices shown (7 of 14)]
[im 1/1]
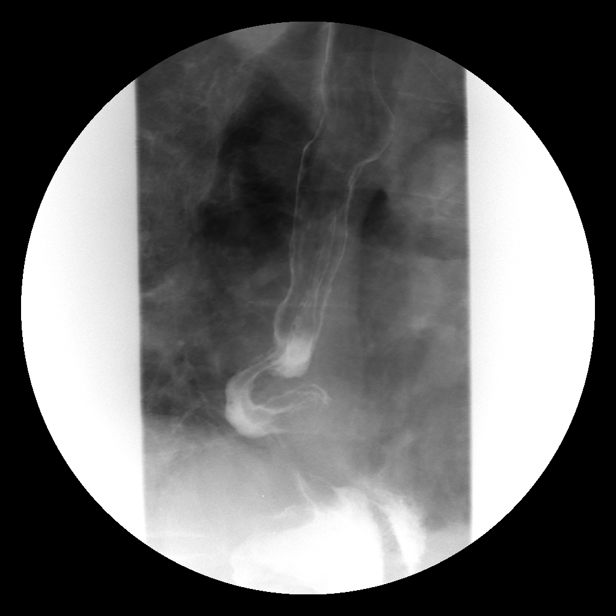

[Series 11: run · 1 of 1 slices shown (8 of 14)]
[im 1/1]
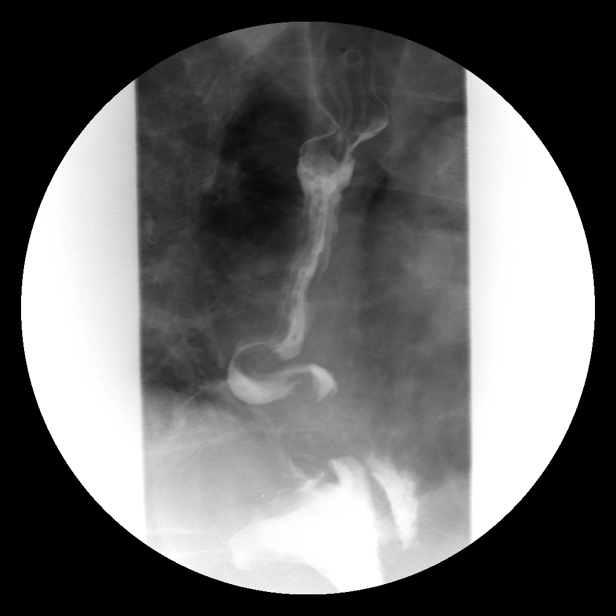

[Series 13: run · 1 of 1 slices shown (9 of 14)]
[im 1/1]
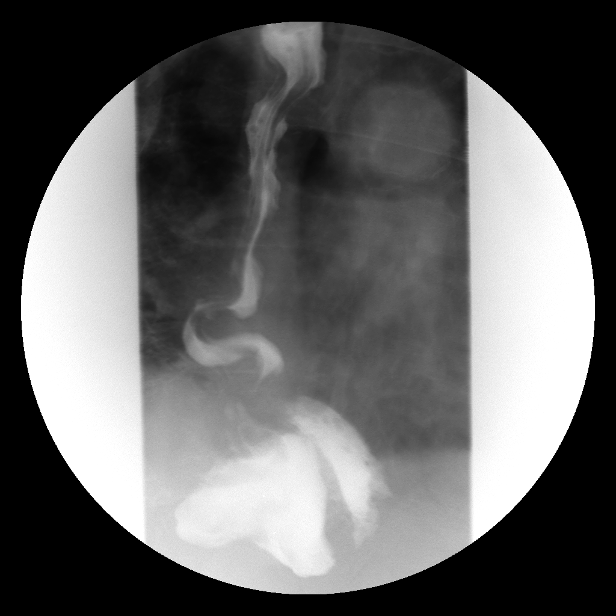

[Series 14: run · 1 of 1 slices shown (10 of 14)]
[im 1/1]
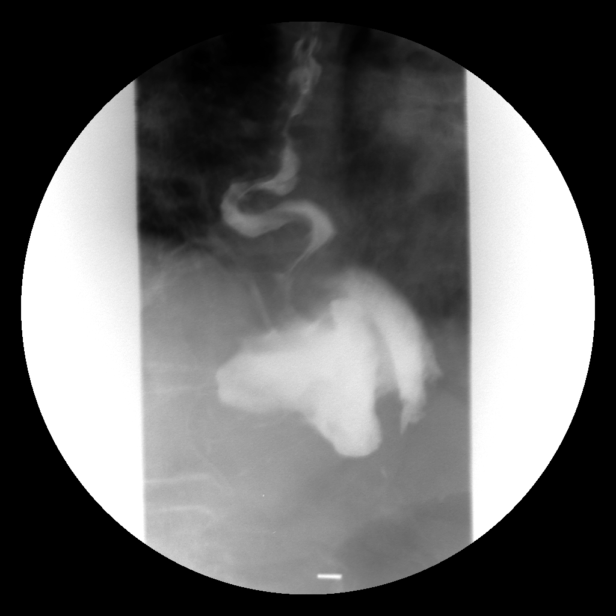

[Series 16: run · 1 of 1 slices shown (11 of 14)]
[im 1/1]
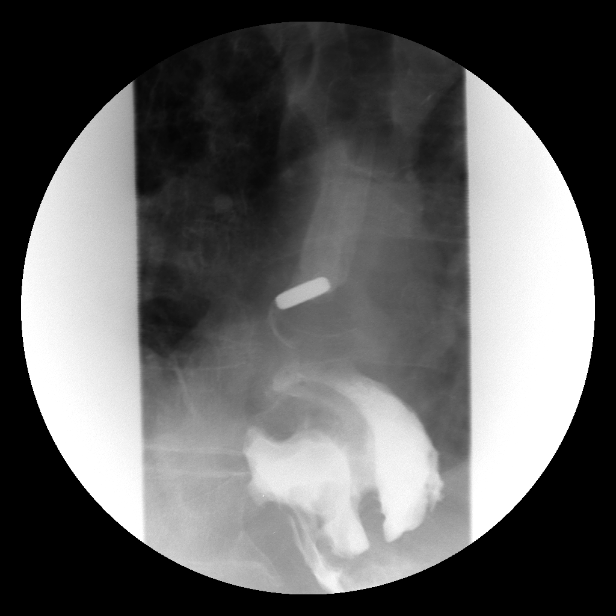

[Series 17: run · 1 of 1 slices shown (12 of 14)]
[im 1/1]
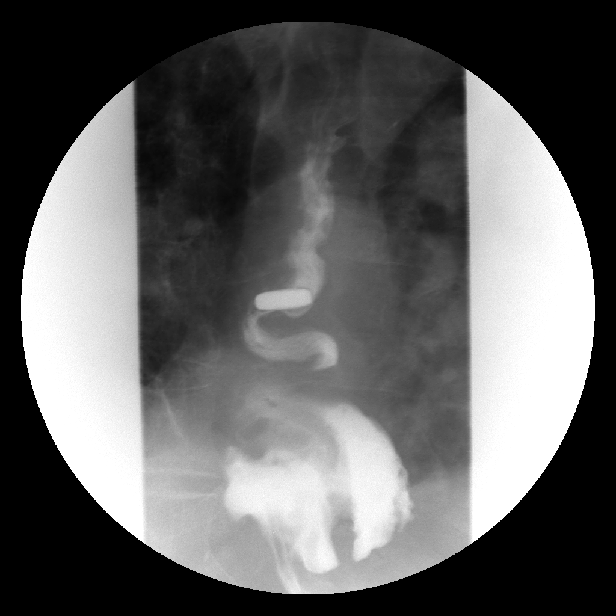

[Series 18: run · 1 of 1 slices shown (13 of 14)]
[im 1/1]
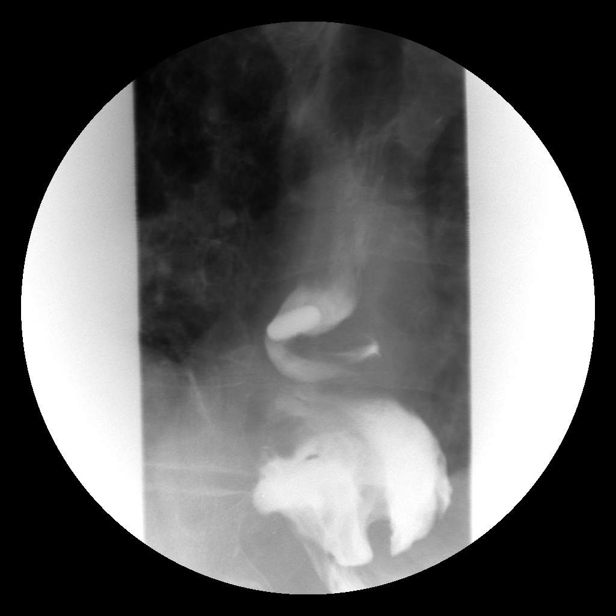

[Series 20: run · 1 of 1 slices shown (14 of 14)]
[im 1/1]
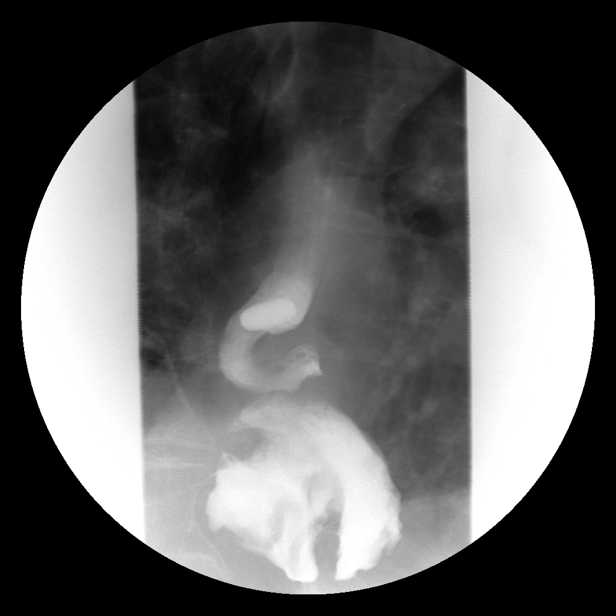

[14 of 20 positions shown; findings below may reference images not displayed]

FINDINGS: Single contrast exam was performed due to patient
immobility.  The patient could not stand and exam was performed at
approximately 30 degrees  incline.

There is no evidence of esophageal mucosal irregularity.  No
evidence of stricture or mass within the cervical or thoracic
esophagus.  In the distal esophagus, there is a "S" shaped
persistent tortuosity with potential stricture in the most distal
aspect.  There is some tertiary contractions associated with this
tortuosity involving the distal esophagus.

A 13mm barium tablet do not proceed through this S-shaped
tortuosity and obstructed in the proximal portion of the distal
esophagus which is similar position to prior.
IMPRESSION: 1.  No evidence of esophageal mucosal irregularity or mass.
2.  Extreme tortuosity of the distal esophagus which obstruct a 13
mm barium tablet.  Cannot exclude a stricturing at the most distal
aspect of this tortuosity.

Findings discussed with Dr. Kaufmann on 03/30/2009

## 2012-05-04 ENCOUNTER — Other Ambulatory Visit: Payer: Self-pay | Admitting: *Deleted

## 2012-05-04 MED ORDER — OXYCODONE HCL 5 MG PO TABS: ORAL_TABLET | ORAL | Status: DC

## 2012-05-14 ENCOUNTER — Encounter (HOSPITAL_BASED_OUTPATIENT_CLINIC_OR_DEPARTMENT_OTHER): Payer: Self-pay | Admitting: Internal Medicine

## 2012-05-14 ENCOUNTER — Non-Acute Institutional Stay (SKILLED_NURSING_FACILITY): Payer: Medicare Other | Admitting: Internal Medicine

## 2012-05-14 DIAGNOSIS — N401 Enlarged prostate with lower urinary tract symptoms: Secondary | ICD-10-CM

## 2012-05-14 DIAGNOSIS — N138 Other obstructive and reflux uropathy: Secondary | ICD-10-CM

## 2012-05-14 DIAGNOSIS — N139 Obstructive and reflux uropathy, unspecified: Secondary | ICD-10-CM

## 2012-05-14 NOTE — Progress Notes (Signed)
Patient ID: Jeremiah Johnson, male   DOB: [birth year], 79 y.o.   MRN: [id number] Chief complaint; review of urinary retention.  History; this is a 79 year old man who has a history of BPH and urinary tract infections. He is on Flomax at 0.4, daily. I was called last week to report that he was having some difficulty voiding. We did an in and out cath on him and I believe obtained 375 cc therefore the catheter has been in since then. Urine culture from the same timeframe revealed greater than 100,000 enterococcus, which was ampicillin sensitive leave. He is to eating a course of treatment for this. The patient is very aggravated and insistent that he wants the Foley catheter removed.  Review of systems.  respiratory no shortness of breath. Cardiac no chest pain. GI no nausea no vomiting.  On examination; Respiratory clear entry bilaterally. Cardiac heart sounds are normal. No murmurs. He does not appear to be dehydrated. Abdomen no liver no spleen no tenderness. GU no suprapubic or costovertebral angle tenderness.  Impressions/plan #1 BPH with urinary retention. He is already on Flomax 0.4 daily and Avodart one capsule every morning. I'm going to increase the Flomax 2.8, and tried to pull the Foley catheter on Wednesday. I have left instructions.

## 2012-06-14 ENCOUNTER — Other Ambulatory Visit: Payer: Self-pay | Admitting: *Deleted

## 2012-06-14 MED ORDER — OXYCODONE HCL 5 MG PO TABS: ORAL_TABLET | ORAL | Status: DC

## 2012-06-22 ENCOUNTER — Other Ambulatory Visit: Payer: Self-pay | Admitting: Geriatric Medicine

## 2012-06-22 MED ORDER — CLONAZEPAM 0.5 MG PO TABS: 0.2500 mg | ORAL_TABLET | Freq: Every day | ORAL | Status: DC

## 2012-07-26 ENCOUNTER — Non-Acute Institutional Stay (SKILLED_NURSING_FACILITY): Payer: Medicare Other | Admitting: Internal Medicine

## 2012-07-26 DIAGNOSIS — D696 Thrombocytopenia, unspecified: Secondary | ICD-10-CM

## 2012-07-26 DIAGNOSIS — M1710 Unilateral primary osteoarthritis, unspecified knee: Secondary | ICD-10-CM

## 2012-07-26 DIAGNOSIS — R627 Adult failure to thrive: Secondary | ICD-10-CM

## 2012-07-26 DIAGNOSIS — H109 Unspecified conjunctivitis: Secondary | ICD-10-CM

## 2012-07-26 DIAGNOSIS — M25569 Pain in unspecified knee: Secondary | ICD-10-CM

## 2012-07-26 DIAGNOSIS — G20 Parkinson's disease: Secondary | ICD-10-CM

## 2012-07-26 DIAGNOSIS — N400 Enlarged prostate without lower urinary tract symptoms: Secondary | ICD-10-CM

## 2012-07-26 NOTE — Progress Notes (Signed)
Patient ID: Jeremiah Johnson, male   DOB: 1918/10/28, 79 y.o.   MRN: [id number]  This is a routine visit.  Level care skilled.  Facility is [facility].  Chief complaint.  Medical management of Parkinson's disease history of osteoarthritic pain thrombocytopenia history of aortic abdominal aneurysm-BPH-depression  History of present illness.  Patient is a pleasant elderly resident with the above diagnoses is been quite stable recently-his pain appears to be controlled on the oxycodone routine which he appears to tolerate well.  He did have a recent history of increased urinary retention he is on Avodart and Flomax was increased-this appears to have had a beneficial effect.    He does have a history frequent UTIs but this has been stable as well for a while.--   Regards to his Parkinson's he continues on Sinemet this has been stable.  Also a history of esophageal dysphagia but he appears to be doing well on proton aches.  He is seen by psychiatric services here he is on Klonopin each bedtime has been quite stable he also is on Ativan when necessary-.  Family medical social history as been reviewed per history and physical on 07/18/2011.  Medications have been reviewed.  Review of systems this is limited secondary to patient being a poor historian.  Marland Kitchen  Physical exam.  Temperature is 97.1 pulse 68 respirations 20 blood pressure 130/59-105/68--weight is 151.6 this is a gain of about 3 pounds the past month.  In general this is a frail elderly male in no distress.  Skin is warm and dry he does have areas where he's had skin cancer removed.  Eyes-pupils appear equal round reactive to light bilaterally he does have some mild erythema and clear drainage from his right eye-the erythema is is confined to the sclera and conjunctiva-visual acuity does not appear to be impaired.  Oropharynx is clear mucous membranes moist.  Chest is clear to auscultation with poor respiratory effort no labored  breathing.  Heart is slightly bradycardic which is baseline without murmur gallop or rub --he has baseline lower extremity edema.  Abdomen-soft nontender with positive bowel sounds.  Extremities she does have arthritic changes of his knees bilaterally with some mild edema this is baseline with limited range of motion which is baseline as well-he does have a history of contractures here.  Neurologic grossly intact limited exam since patient is in bed but I do not see any lateralizing findings.  Psych he is oriented to self only pleasant does follow simple verbal commands  .  Labs.    05/06/2012.  WBC 7.0 hemoglobin 12.8 platelets 121.  Sodium 1:30 potassium 4.3 BUN 13 creatinine 0.96.  12/05/2011.  TSH-0.290.  Assessment and plan.  #1-failure to thrive-he actually has gained weight is doing better in this regard--he is on supplementation  #2-history of Parkinson's-this appears to be relatively stable on Sinemet.  #3 BPH-Flomax as been increased he continues on Avodart at this point appears to be controlled.  #4-history of osteoarthritic pain-this appears stable he is on the oxycodone with good effect.  #5-history of chronic anemia-this appears stable we have tried to minimize labs secondary to family desires for less aggressive-more comfort care approach.  #6-history of thrombocytopenia-this appears stable.  #7-history of anxiety this is stable on Klonopin routine at night---also  Ativan when necessary.  He is followed by psych services.  #8-history of DVT he is on aspirin.  #9 day history of abdominal aortic aneurysm-with emphasis on comfort care this has not been pursued aggressively  for followup.  Over 10-conjunctivitis-this appears to be allergic type we'll treat with Patanol eye drops for a week and monitor  [phone]

## 2012-08-01 ENCOUNTER — Other Ambulatory Visit: Payer: Self-pay | Admitting: Geriatric Medicine

## 2012-08-01 MED ORDER — OXYCODONE HCL 5 MG PO TABS: ORAL_TABLET | ORAL | Status: DC

## 2012-08-14 ENCOUNTER — Other Ambulatory Visit (HOSPITAL_COMMUNITY): Payer: Medicare Other

## 2012-08-14 ENCOUNTER — Inpatient Hospital Stay (HOSPITAL_COMMUNITY)
Admit: 2012-08-14 | Discharge: 2012-08-14 | Disposition: A | Discharge status: Home / Self Care | Payer: Medicare Other | Attending: Speech Pathology | Admitting: Speech Pathology

## 2012-08-15 ENCOUNTER — Ambulatory Visit (HOSPITAL_COMMUNITY)
Admit: 2012-08-15 | Discharge: 2012-08-15 | Disposition: A | Discharge status: Home / Self Care | Payer: Medicare Other | Attending: Internal Medicine | Admitting: Internal Medicine

## 2012-08-15 ENCOUNTER — Ambulatory Visit (HOSPITAL_COMMUNITY)
Admit: 2012-08-15 | Discharge: 2012-08-15 | Disposition: A | Discharge status: Home / Self Care | Payer: Medicare Other | Source: Skilled Nursing Facility | Attending: Internal Medicine | Admitting: Internal Medicine

## 2012-08-15 DIAGNOSIS — IMO0001 Reserved for inherently not codable concepts without codable children: Secondary | ICD-10-CM | POA: Insufficient documentation

## 2012-08-15 DIAGNOSIS — R1310 Dysphagia, unspecified: Secondary | ICD-10-CM | POA: Insufficient documentation

## 2012-08-15 DIAGNOSIS — R1312 Dysphagia, oropharyngeal phase: Secondary | ICD-10-CM | POA: Insufficient documentation

## 2012-08-15 NOTE — Procedures (Signed)
Objective Swallowing Evaluation: Modified Barium Swallowing Study   Patient Details  Name: Jeremiah Johnson MRN: [id number] Date of Birth: 1918-01-27  Today's Date: 08/15/2012 Time:  - 11:22 AM    Past Medical History:  Past Medical History  Diagnosis Date  . Falls frequently   . Parkinson's disease   . Wrist contusion   . Dementia   . PTSD (post-traumatic stress disorder)   . COPD (chronic obstructive pulmonary disease)   . BPH (benign prostatic hyperplasia)   . CAD (coronary artery disease)   . Anxiety    Past Surgical History:  Past Surgical History  Procedure Laterality Date  . None     HPI:  Mr. Jeremiah Johnson is a 79 yo male resident at [facility] who was referred by Dr. Leanord Hawking for [facility] for possible diet upgrade. Pt has been on a mechanical soft diet with pureed meats and nectar-thick liquids, however family frequently brings him regular texture foods and thin liquids. Family is hopeful that pt might be allowed to have a less restrictive diet.   Symptoms/Limitations Symptoms: "He is on mechanical soft with pureed meats and nectar-thick liquids, but family brings him regular snacks and sodas." Special Tests: MBSS  Recommendation/Prognosis  Clinical Impression Dysphagia Diagnosis: Mild oral phase dysphagia;Mild pharyngeal phase dysphagia Clinical impression: Mild oropharyngeal sensorimotor based dysphagia characterized by slow oral prep, good mastication of solids, premature spillage with delay in swallow initiation- triggering after spilling to pyriforms with nectars and thin liquids, decreased laryngeal elevation with flash penetration of thins during the swallow and variable trace aspiration of thins during and after (from residuals during sequential swallows via straw sips) without spontaneous cough response (although pt with wet vocal quality). Once pt was cued to cough strongly, he removed the aspirate. Chin tuck was effective 50% of the time and thus judged to not be  beneficial. Recommend regular textures (no mixed consistencies thin) and nectar-thick liquids during meals with standard aspiration precautions. Consider small sips thin liquids for pt pleasure/comfort  between meals with stict supervision. Pt is likely to aspirate thins, but this risk can be minimized by taking small sips and coughing after swallow. Treating SLP will discuss with pt and family.  Swallow Evaluation Recommendations Diet Recommendations: Regular;Nectar-thick liquid (consider small sips thin b/w meals for pt comfort) Liquid Administration via: Cup;Straw Medication Administration: Crushed with puree Supervision: Patient able to self feed;Intermittent supervision to cue for compensatory strategies Compensations: Slow rate;Small sips/bites (hard cough after swallowing thins if given) Postural Changes and/or Swallow Maneuvers: Out of bed for meals;Seated upright 90 degrees;Upright 30-60 min after meal Oral Care Recommendations: Oral care BID Other Recommendations: Order thickener from pharmacy;Clarify dietary restrictions Follow up Recommendations: Skilled Nursing facility   Individuals Consulted Consulted and Agree with Results and Recommendations: Patient;Patient unable/family or caregiver not available;Other (Comment) (primary SLP) Report Sent to : Primary SLP;Referring physician  SLP Assessment/Plan Dysphagia Diagnosis: Mild oral phase dysphagia;Mild pharyngeal phase dysphagia Clinical impression: Mild oropharyngeal sensorimotor based dysphagia characterized by slow oral prep, good mastication of solids, premature spillage with delay in swallow initiation- triggering after spilling to pyriforms with nectars and thin liquids, decreased laryngeal elevation with flash penetration of thins during the swallow and variable trace aspiration of thins during and after (from residuals during sequential swallows via straw sips) without spontaneous cough response (although pt with wet vocal  quality). Once pt was cued to cough strongly, he removed the aspirate. Chin tuck was effective 50% of the time and thus judged to not be  beneficial. Recommend regular textures (no mixed consistencies thin) and nectar-thick liquids during meals with standard aspiration precautions. Consider small sips thin liquids for pt pleasure/comfort  between meals with stict supervision. Pt is likely to aspirate thins, but this risk can be minimized by taking small sips and coughing after swallow. Treating SLP will discuss with pt and family.   General:  Date of Onset: 08/13/12 HPI: Mr. Jeremiah Johnson is a 79 yo male resident at [facility] who was referred by Dr. Leanord Hawking for [facility] for possible diet upgrade. Pt has been on a mechanical soft diet with pureed meats and nectar-thick liquids, however family frequently brings him regular texture foods and thin liquids. Family is hopeful that pt might be allowed to have a less restrictive diet.  Type of Study: Modified Barium Swallowing Study Reason for Referral: Objectively evaluate swallowing function Previous Swallow Assessment: July 2013 MBSS with recommendation for D3/NTL with trials tsp thins with SLP Diet Prior to this Study: Dysphagia 3 (soft);Nectar-thick liquids (puree meat) Temperature Spikes Noted: No Respiratory Status: Room air Behavior/Cognition: Alert;Cooperative;Pleasant mood;Hard of hearing Oral Cavity - Dentition: Dentures, top;Dentures, bottom Oral Motor / Sensory Function: Within functional limits Self-Feeding Abilities: Needs set up Patient Positioning: Upright in chair Baseline Vocal Quality: Clear Volitional Cough: Strong Volitional Swallow: Able to elicit Anatomy: Within functional limits Pharyngeal Secretions: Not observed secondary MBS  Reason for Referral:  Objectively evaluate swallowing function   Oral Phase Oral Preparation/Oral Phase Oral Phase: Impaired Oral - Solids Oral - Regular: Delayed oral transit Oral - Pill: Not  tested Oral Phase - Comment Oral Phase - Comment: Mildly slow oral prep in setting of dentures  Pharyngeal Phase  Pharyngeal Phase Pharyngeal Phase: Impaired Pharyngeal - Nectar Pharyngeal - Nectar Cup: Delayed swallow initiation;Premature spillage to valleculae;Premature spillage to pyriform sinuses;Reduced laryngeal elevation;Penetration/Aspiration during swallow Penetration/Aspiration details (nectar cup): Material does not enter airway;Material enters airway, remains ABOVE vocal cords then ejected out Pharyngeal - Nectar Straw: Delayed swallow initiation;Premature spillage to valleculae;Premature spillage to pyriform sinuses;Reduced laryngeal elevation Penetration/Aspiration details (nectar straw): Material does not enter airway Pharyngeal - Thin Pharyngeal - Thin Teaspoon: Delayed swallow initiation;Premature spillage to valleculae;Premature spillage to pyriform sinuses;Reduced laryngeal elevation;Reduced airway/laryngeal closure;Penetration/Aspiration during swallow Penetration/Aspiration details (thin teaspoon): Material does not enter airway;Material enters airway, remains ABOVE vocal cords then ejected out Pharyngeal - Thin Cup: Delayed swallow initiation;Premature spillage to valleculae;Premature spillage to pyriform sinuses;Reduced airway/laryngeal closure;Reduced laryngeal elevation;Penetration/Aspiration before swallow;Penetration/Aspiration during swallow;Trace aspiration;Lateral channel residue;Other (Comment) (trace residuals in laryngeal vestibule) Penetration/Aspiration details (thin cup): Material does not enter airway;Material enters airway, remains ABOVE vocal cords then ejected out;Material enters airway, passes BELOW cords without attempt by patient to eject out (silent aspiration) (cued strong cough removed aspirate) Pharyngeal - Thin Straw: Delayed swallow initiation;Premature spillage to valleculae;Premature spillage to pyriform sinuses;Reduced laryngeal elevation;Reduced  airway/laryngeal closure;Penetration/Aspiration during swallow;Penetration/Aspiration after swallow;Trace aspiration;Lateral channel residue Penetration/Aspiration details (thin straw): Material enters airway, remains ABOVE vocal cords then ejected out;Material enters airway, passes BELOW cords without attempt by patient to eject out (silent aspiration) Pharyngeal - Solids Pharyngeal - Puree: Within functional limits;Premature spillage to valleculae;Delayed swallow initiation Pharyngeal - Regular: Within functional limits;Delayed swallow initiation;Premature spillage to valleculae  Cervical Esophageal Phase  Cervical Esophageal Phase Cervical Esophageal Phase: [facility]   G-Codes Functional Assessment Tool Used: MBSS Functional Limitations: Swallowing Swallow Current Status (R6045): At least 1 percent but less than 20 percent impaired, limited or restricted Swallow Goal Status [phone]): At least 1 percent but less than 20 percent impaired, limited  or restricted Swallow Discharge Status [phone]): At least 1 percent but less than 20 percent impaired, limited or restricted   Thank you,  Havery Moros, CCC-SLP [phone]  Avelynn Sellin 08/15/2012, 1:44 PM

## 2012-08-28 ENCOUNTER — Other Ambulatory Visit: Payer: Self-pay | Admitting: Geriatric Medicine

## 2012-08-28 MED ORDER — OXYCODONE HCL 5 MG PO TABS: ORAL_TABLET | ORAL | Status: DC

## 2012-09-27 ENCOUNTER — Non-Acute Institutional Stay (SKILLED_NURSING_FACILITY): Payer: Medicare Other | Admitting: Internal Medicine

## 2012-09-27 DIAGNOSIS — IMO0002 Reserved for concepts with insufficient information to code with codable children: Secondary | ICD-10-CM

## 2012-09-27 DIAGNOSIS — R1319 Other dysphagia: Secondary | ICD-10-CM

## 2012-09-27 DIAGNOSIS — D696 Thrombocytopenia, unspecified: Secondary | ICD-10-CM

## 2012-09-27 DIAGNOSIS — N400 Enlarged prostate without lower urinary tract symptoms: Secondary | ICD-10-CM

## 2012-09-27 DIAGNOSIS — L309 Dermatitis, unspecified: Secondary | ICD-10-CM

## 2012-09-27 DIAGNOSIS — L259 Unspecified contact dermatitis, unspecified cause: Secondary | ICD-10-CM

## 2012-09-27 DIAGNOSIS — G20A1 Parkinson's disease without dyskinesia, without mention of fluctuations: Secondary | ICD-10-CM

## 2012-09-27 DIAGNOSIS — R627 Adult failure to thrive: Secondary | ICD-10-CM

## 2012-09-27 DIAGNOSIS — G20 Parkinson's disease: Secondary | ICD-10-CM

## 2012-09-27 DIAGNOSIS — M1710 Unilateral primary osteoarthritis, unspecified knee: Secondary | ICD-10-CM

## 2012-09-27 NOTE — Progress Notes (Signed)
Patient ID: Jeremiah Johnson, male   DOB: 28-Jun-1918, 79 y.o.   MRN: [id number] This is a routine visit.  Level care skilled.  Facility is [facility].    Chief complaint.  Medical management of Parkinson's disease history of osteoarthritic pain thrombocytopenia history of aortic abdominal aneurysm-BPH-depression   History of present illness.  Patient is a pleasant elderly resident with the above diagnoses is been quite stable recently-his pain appears to be controlled on the oxycodone routine which he appears to tolerate well.  He did have a recent history of increased urinary retention he is on Avodart and Flomax was increased-this appears to have had a beneficial effect.  He does have a history frequent UTIs but this has been stable as well for a while.--  Regards to his Parkinson's he continues on Sinemet this has been stable.  Also a history of esophageal dysphagia but he appears to be doing well on protonix.  He is seen by psychiatric services here he is on Klonopin each bedtime has been quite stable he also is on Ativan when necessary-. He has developed what appears to be a mole-seborrheic keratosis right side of his face he says it bothers him because that's where his glasses go and whenever he puts his glasses on it irritates the site    Family medical social history as been reviewed per history and physical on 07/18/2011 .  Medications have been reviewed.   Review of systems this is limited secondary to patient being a poor historian. --He does not complaining of shortness of breath-pain appears to be controlled-only complaint is the area on the right side of his face irritating him .  Physical exam.  Temperature is 97.9 pulse 70 respirations 20 blood pressure 119/60 weight is stable at 148.8   In general this is a frail elderly male in no distress.  Skin is warm and dry he does have areas where he's had skin cancer removed--on her right temple area there is a slightly irritated seborrheic  keratosis it appears--- there is no surrounding erythema or drainage or sign of infection.  Eyes-pupils appear equal round reactive sclerae and conjunctivae are clear visual acuity appears grossly intact he does have prescription lenses   Oropharynx is clear mucous membranes moist.  Chest is clear to auscultation with poor respiratory effort no labored breathing.  Heart is slightly bradycardic which is baseline without murmur gallop or rub --he has baseline lower extremity edema.  Abdomen-soft nontender with positive bowel sounds.  Extremities she does have arthritic changes of his knees bilaterally with some mild edema this is baseline with limited range of motion which is baseline as well-he does have a history of contractures here.  Neurologic grossly intact --t I do not see any lateralizing findings.  Psych he is oriented to self only pleasant does follow simple verbal commands  .  Labs.    05/06/2012.  WBC 7.0 hemoglobin 12.8 platelets 121.  Sodium 1:30 potassium 4.3 BUN 13 creatinine 0.96.  12/05/2011.  TSH-0.290.   Assessment and plan.  #1-failure to thrive-actually continues to do quite well family does not wish aggressive workup of issues or labs-he appears to be doing very well in this regard #2-history of Parkinson's-this appears to be relatively stable on Sinemet.  #3 BPH-Flomax as been increased he continues on Avodart at this point appears to be controlled.  #4-history of osteoarthritic pain-this appears stable he is on the oxycodone with good effect.  #5-history of chronic anemia-this appears stable we have tried  to minimize labs secondary to family desires for less aggressive-more comfort care approach.  #6-history of thrombocytopenia-this appears stable.  #7-history of anxiety this is stable on Klonopin routine at night---also Ativan when necessary.  He is followed by psych services.  #8-history of DVT he is on aspirin.  #9 day history of abdominal aortic aneurysm-with  emphasis on comfort care this has not been pursued aggressively for followup.  #10-dermatitis unspecified-will write dermatology consult for  Possible   removal of the seborrheic keratosis it is irritating  him   [phone]

## 2012-11-01 ENCOUNTER — Other Ambulatory Visit: Payer: Self-pay | Admitting: *Deleted

## 2012-11-01 MED ORDER — OXYCODONE HCL 5 MG PO TABS: ORAL_TABLET | ORAL | Status: DC

## 2012-12-12 ENCOUNTER — Other Ambulatory Visit: Payer: Self-pay | Admitting: *Deleted

## 2012-12-12 MED ORDER — OXYCODONE HCL 5 MG PO TABS: ORAL_TABLET | ORAL | Status: DC

## 2012-12-12 MED ORDER — CLONAZEPAM 0.5 MG PO TABS: ORAL_TABLET | ORAL | Status: DC

## 2012-12-25 ENCOUNTER — Non-Acute Institutional Stay (SKILLED_NURSING_FACILITY): Payer: Medicare Other | Admitting: Internal Medicine

## 2012-12-25 DIAGNOSIS — M1710 Unilateral primary osteoarthritis, unspecified knee: Secondary | ICD-10-CM

## 2012-12-25 DIAGNOSIS — R627 Adult failure to thrive: Secondary | ICD-10-CM

## 2012-12-25 DIAGNOSIS — G20A1 Parkinson's disease without dyskinesia, without mention of fluctuations: Secondary | ICD-10-CM

## 2012-12-25 DIAGNOSIS — D696 Thrombocytopenia, unspecified: Secondary | ICD-10-CM

## 2012-12-25 DIAGNOSIS — G20 Parkinson's disease: Secondary | ICD-10-CM

## 2012-12-25 DIAGNOSIS — N400 Enlarged prostate without lower urinary tract symptoms: Secondary | ICD-10-CM

## 2012-12-25 DIAGNOSIS — IMO0002 Reserved for concepts with insufficient information to code with codable children: Secondary | ICD-10-CM

## 2012-12-25 NOTE — Progress Notes (Signed)
Patient ID: Jeremiah Johnson, male   DOB: Oct 01, 1918, 79 y.o.   MRN: [id number] This is a routine visit.  Level care skilled.  Facility is [facility].   Chief complaint.  Medical management of Parkinson's disease history of osteoarthritic pain thrombocytopenia history of aortic abdominal aneurysm-BPH-depression   History of present illness.  Patient is a pleasant 79 year old resident with the above diagnoses is been quite stable recently-his pain appears to be controlled on the oxycodone routine which he appears to tolerate well.  He did have a recent history of increased urinary retention he is on Avodart and Flomax was increased-this appears to have had a beneficial effect--although at times he will still complaining of this.  He does have a history frequent UTIs but this has been stable as well for a while.--  Regards to his Parkinson's he continues on Sinemet this has been stable.  Also a history of esophageal dysphagia but he appears to be doing well on protonix.  He is seen by psychiatric services here he is on Klonopin each bedtime has been quite stable he also is on Ativan when necessary- When I saw him earlier this year he did have a seborrheic keratosis on the right side of his face that was irritating him especially when he wore glasses-this has since been removed.       Family medical social history as been reviewed per history and physical on 07/18/2011  .  Medications have been reviewed.  Review of systems this is limited secondary to patient being a poor historian. --He does not complaining of shortness of breath-pain appears to be controlled-does occasionally complain of slow urinary flow-does not complain of dysuria or pain .  Physical exam.  Temperature 97.8 pulse 72 respirations 20 blood pressure 124/58 all these appear relatively baseline I do note occasional pulses in the 50s which is not new he is asymptomatic-weight is relatively stable at 145.6   In general this is a frail  elderly male in no distress.  Skin is warm and dry he does have areas where he's had skin cancer removed-area on right temple appears to be well healed   Eyes-pupils appear equal round reactive sclerae and conjunctivae are clear visual acuity appears grossly intact--  Oropharynx is clear mucous membranes moist.  Chest is clear to auscultation with poor respiratory effort no labored breathing.  Heart is slightly bradycardic which is baseline without murmur gallop or rub --he has baseline lower extremity edema.  Abdomen-soft nontender with positive bowel sounds.  Extremities she does have arthritic changes of his knees bilaterally with some mild edema this is baseline with limited range of motion which is baseline as well-he does have a history of contractures here.  Neurologic grossly intact -- I do not see any lateralizing findings.--His speech is clear  Psych he is oriented to self only pleasant does follow simple verbal commands  .  Labs .  05/06/2012.  WBC 7.0 hemoglobin 12.8 platelets 121.  Sodium 1:30 potassium 4.3 BUN 13 creatinine 0.96.  12/05/2011.  TSH-0.290.   Assessment and plan.  #1-failure to thrive-actually continues to do quite well family does not wish aggressive workup of issues or labs-he appears to be doing very well in this regard --weight continues to be relatively stable appetite appears to be fairly decent #2-history of Parkinson's-this appears to be relatively stable on Sinemet.  #3 BPH-Flomax as been increased he continues on Avodart at this point appears to be controlled--occasionally he will complain of a slow  stream but apparently these are not persistent complaints.  #4-history of osteoarthritic pain-this appears stable he is on the oxycodone with good effect.  #5-history of chronic anemia-this appears stable we have tried to minimize labs secondary to family desires for less aggressive-more comfort care approach.  #6-history of thrombocytopenia-this appears  stable --will update CBC if family is comfortable with this.  #7-history of anxiety this is stable on Klonopin routine at night---also Ativan when necessary.  He is followed by psych services.  #8-history of DVT he is on aspirin.  #9 day history of abdominal aortic aneurysm-with emphasis on comfort care this has not been pursued aggressively for followup.   Of note if family is comfortable we will order a CBC as well as basic metabolic panel for updated values clinically he appears stable--will have nursing staff contact family before drawing labs to make sure it is okay with them  [phone]   [phone]

## 2013-01-13 IMAGING — US US AORTA
1 series · 14 of 25 positions shown · non-contrast
Comparison: 09/03/2009

CLINICAL DATA: Follow-up abdominal aortic aneurysm

ULTRASOUND OF ABDOMINAL AORTA
TECHNIQUE: Ultrasound examination of the abdominal aorta was
performed to evaluate for abdominal aortic aneurysm.

[Series 1: us aorta · 0.27mm/px · 14 of 46 slices shown]
[im 1/46]
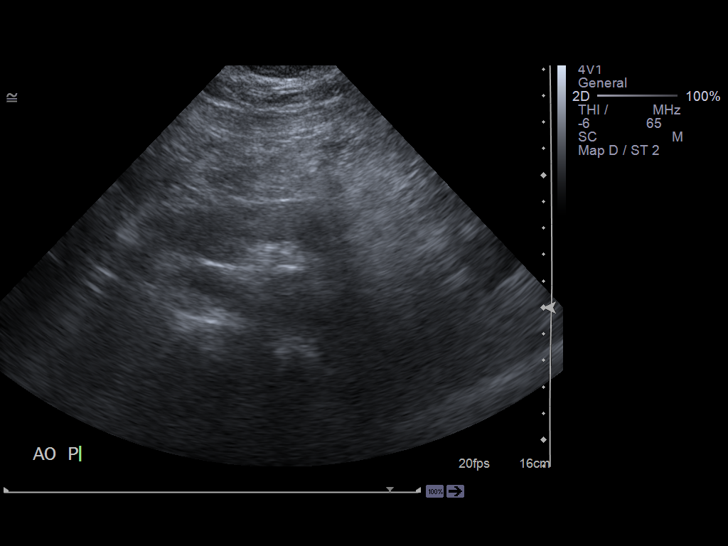
[im 4/46]
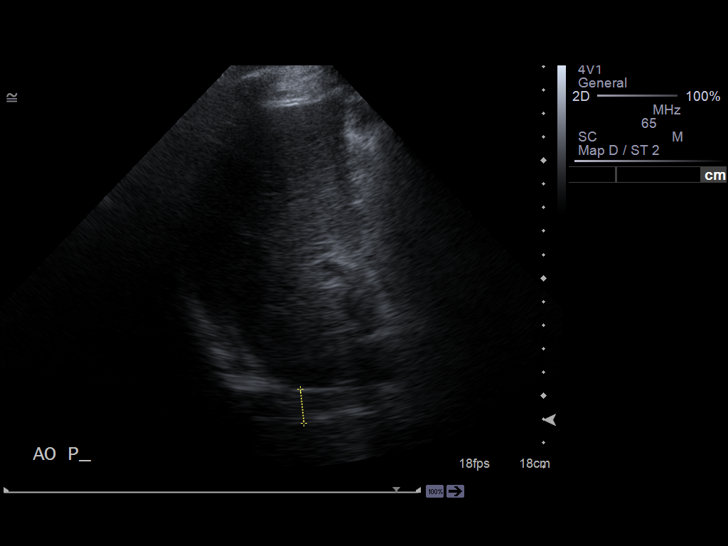
[im 8/46]
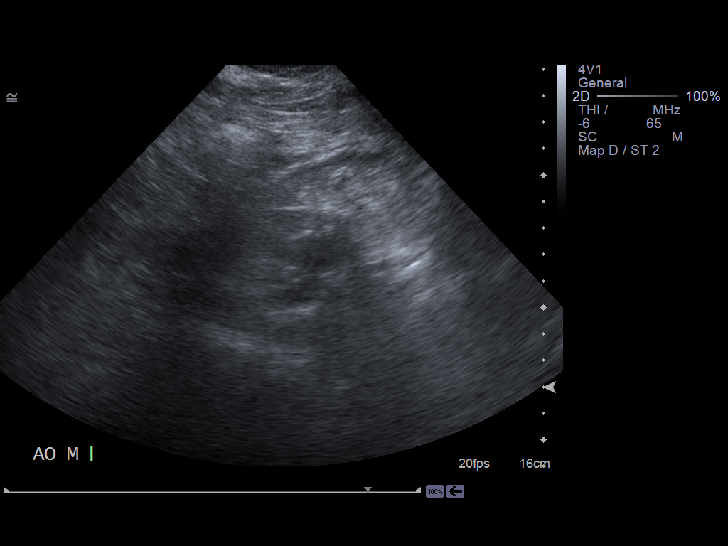
[im 12/46]
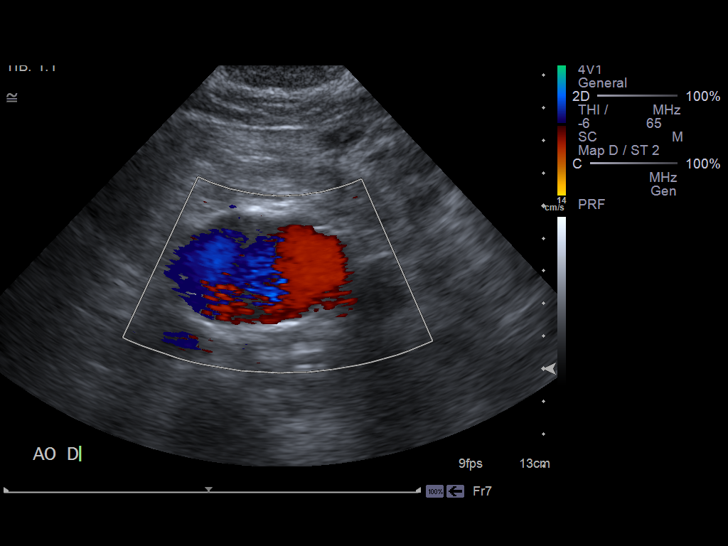
[im 16/46]
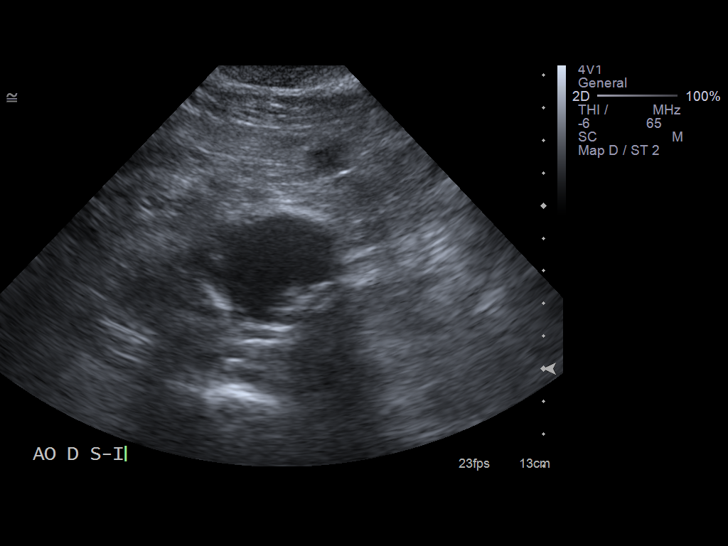
[im 17/46]
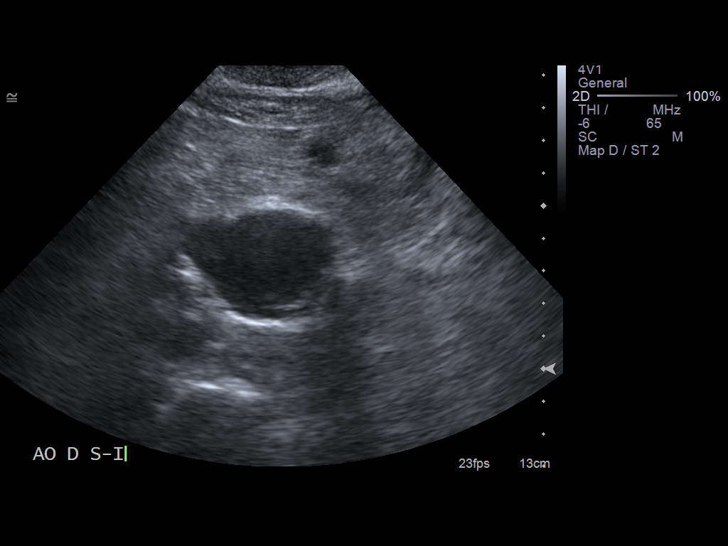
[im 21/46]
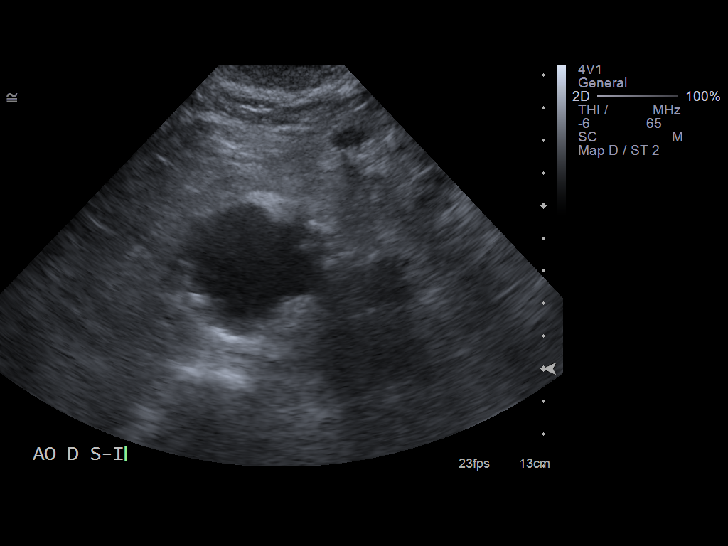
[im 25/46]
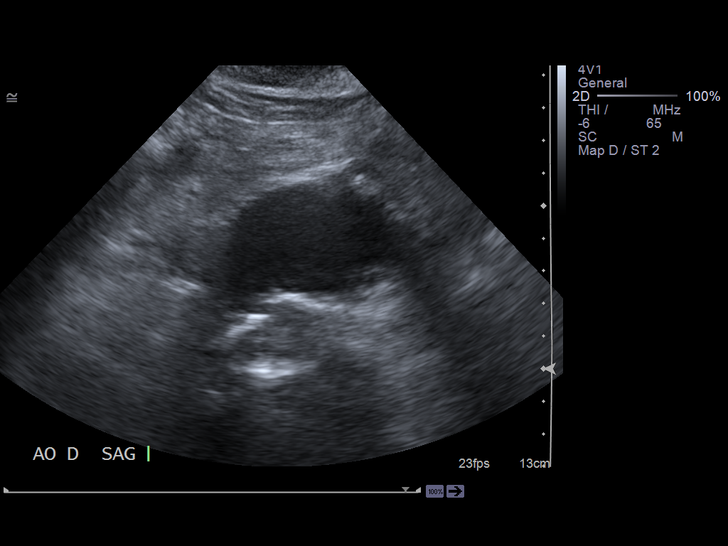
[im 29/46]
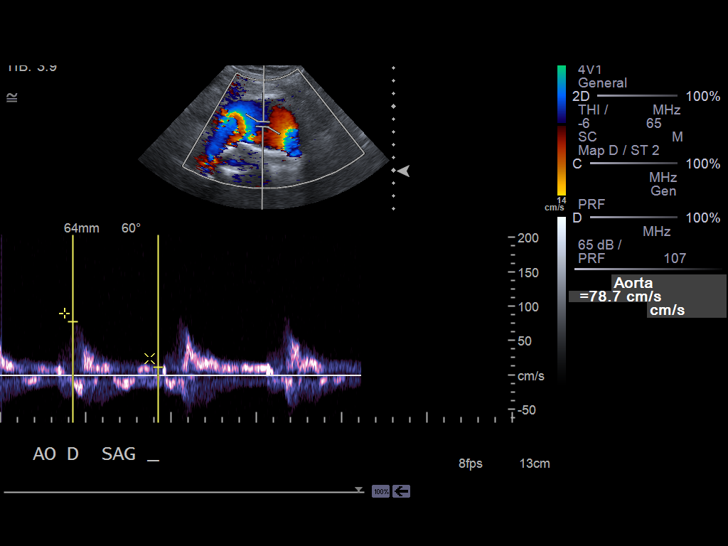
[im 31/46]
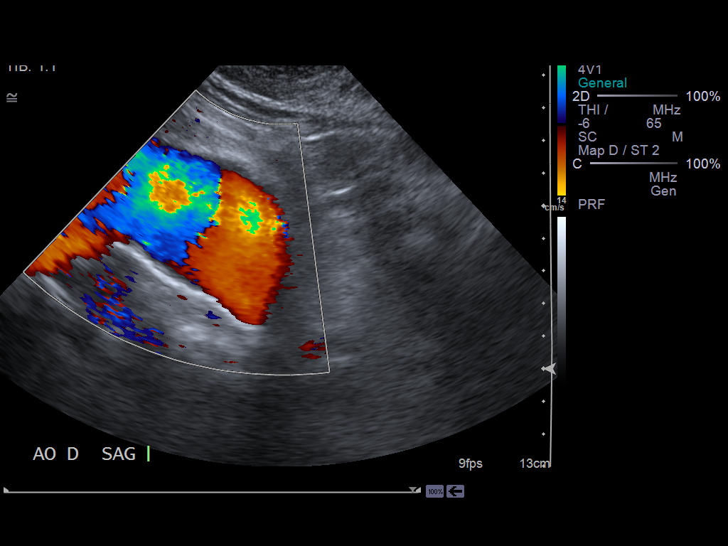
[im 34/46]
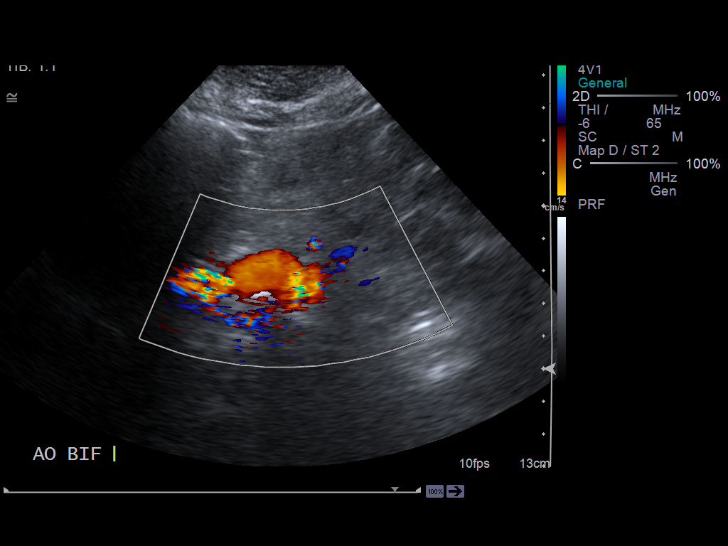
[im 38/46]
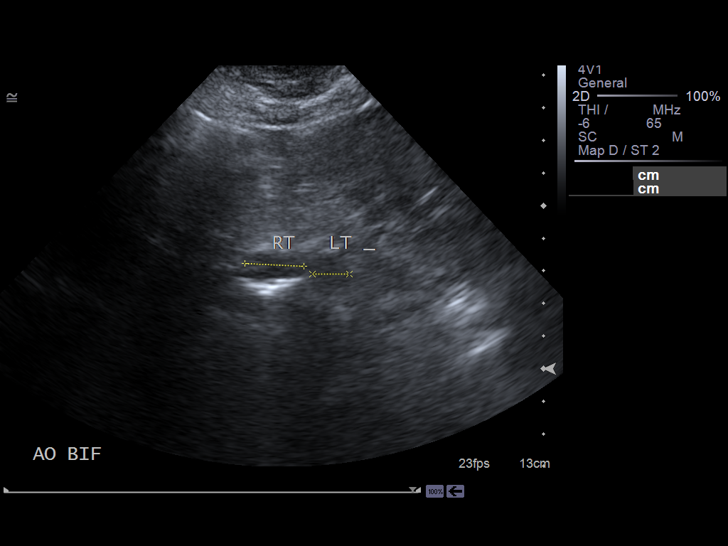
[im 42/46]
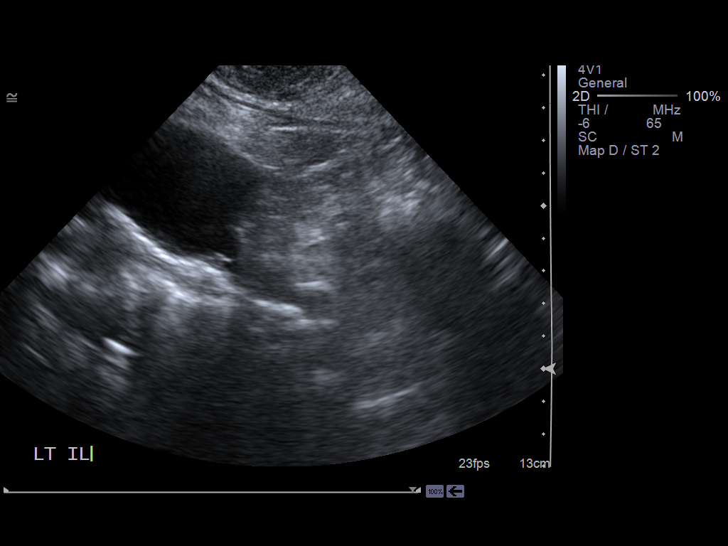
[im 46/46]
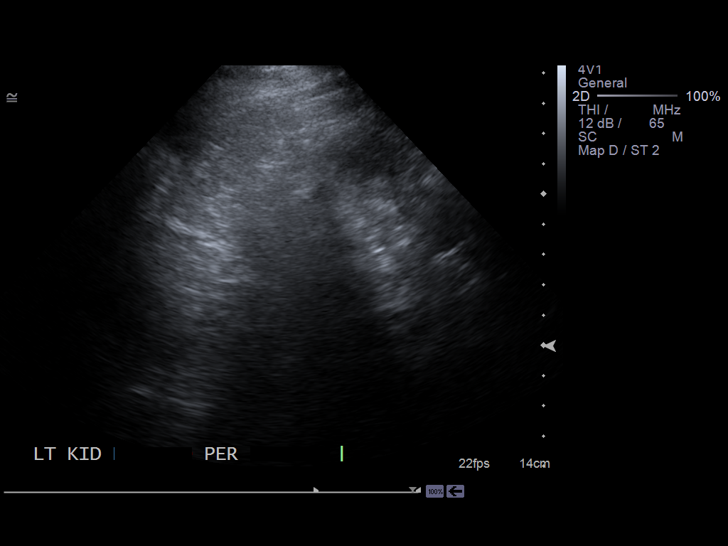

[14 of 25 positions shown; findings below may reference images not displayed]

Abdominal Aorta:  Infrarenal distal aortic aneurysm again noted

      Maximum AP diameter:  4.2 cm.
      Maximum TRV diameter:  5.7 cm.
IMPRESSION: Infrarenal abdominal aortic aneurysm again noted.  Compared to the
prior study, this is slightly larger in transverse diameter.

## 2013-02-01 ENCOUNTER — Other Ambulatory Visit: Payer: Self-pay | Admitting: *Deleted

## 2013-02-01 MED ORDER — OXYCODONE HCL 5 MG PO TABS: ORAL_TABLET | ORAL | Status: DC

## 2013-02-01 NOTE — Telephone Encounter (Signed)
rx filled per protocol  

## 2013-02-27 ENCOUNTER — Other Ambulatory Visit: Payer: Self-pay | Admitting: *Deleted

## 2013-02-27 MED ORDER — OXYCODONE HCL 5 MG PO TABS: ORAL_TABLET | ORAL | Status: DC

## 2013-02-27 NOTE — Telephone Encounter (Signed)
[facility] 

## 2013-03-15 ENCOUNTER — Non-Acute Institutional Stay (SKILLED_NURSING_FACILITY): Payer: Medicare Other | Admitting: Internal Medicine

## 2013-03-15 ENCOUNTER — Encounter: Payer: Self-pay | Admitting: Internal Medicine

## 2013-03-15 DIAGNOSIS — M1710 Unilateral primary osteoarthritis, unspecified knee: Secondary | ICD-10-CM

## 2013-03-15 DIAGNOSIS — I714 Abdominal aortic aneurysm, without rupture, unspecified: Secondary | ICD-10-CM | POA: Insufficient documentation

## 2013-03-15 DIAGNOSIS — IMO0002 Reserved for concepts with insufficient information to code with codable children: Secondary | ICD-10-CM

## 2013-03-15 DIAGNOSIS — D696 Thrombocytopenia, unspecified: Secondary | ICD-10-CM

## 2013-03-15 DIAGNOSIS — R319 Hematuria, unspecified: Secondary | ICD-10-CM | POA: Insufficient documentation

## 2013-03-15 DIAGNOSIS — G20A1 Parkinson's disease without dyskinesia, without mention of fluctuations: Secondary | ICD-10-CM

## 2013-03-15 DIAGNOSIS — N390 Urinary tract infection, site not specified: Secondary | ICD-10-CM

## 2013-03-15 DIAGNOSIS — G20 Parkinson's disease: Secondary | ICD-10-CM

## 2013-03-15 DIAGNOSIS — I82409 Acute embolism and thrombosis of unspecified deep veins of unspecified lower extremity: Secondary | ICD-10-CM

## 2013-03-15 DIAGNOSIS — R627 Adult failure to thrive: Secondary | ICD-10-CM

## 2013-03-15 NOTE — Progress Notes (Signed)
Patient ID: Jeremiah Johnson, male   DOB: 1918-05-27, 80 y.o.   MRN: [id number]   This is an acute visit/routine visit.  Level of care skilled.  Facility [facility].  Chief complaint-acute visit secondary to hematuria-medical management of chronic medical conditions including Parkinson's disease osteoarthritis pain thrombocytopenia history of aortic abdominal aneurysm BPH depression.  History of present illness.  Patient is a pleasant 80 year old male with the above diagnoses is generally stable however this evening he did sustain what appears to be a fairly sudden onset of gross hematuria-his diaper appeared to be fairly bloodsoaked-and he actually still had some bleeding from his penis when I arrived in the room.  Initially he complained of some suprapubic discomfort but when I reevaluated him later he did not really complain of any pain.  He does have a history of BPH and urinary tract infections in the past.  He is on Flomax as well as Avodart.  His vital signs continued to be stable.  Note he does have a history of thrombocytopenia most recent platelet count was 112,000 back in December 2014-he is also on aspirin for previous history of DVT.  In regard to his other issues these appear to be fairly baseline.  He continues on Sinemet for a history of Parkinson's this has been stable for some time.  Also a history of esophageal dysphagia on protonic switches been stable.  He does have a history of anxiety ist on Klonopin as well as Ativan when necessary--.  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  Medications have been reviewed per [facility].  Review of systems.  Limited secondary to dementia initially appeared to complain of some suprapubic discomfort but Henri examination did not complaining of any pain-did not complain of any shortness of breath chest pain fever  chills.  Physical exam.  Temperature is 97.8 pulse 60 respirations 20 blood pressure 134/73.  In general this is a frail elderly male in no distress but somewhat anxious about the hematuria.  His skin is warm and dry he does have some chronic areas for skin cancer as been removed these appeared to be baseline with some chronic old bruising as well.  Eyes pupils appear equal round react to light sclera and conjunctiva are clear visual acuity appears grossly intact.  Oropharynx clear mucous membranes moist.  Chest is clear to auscultation there is no labored breathing.  Heart is regular rate and rhythm without murmur gallop or rub he has baseline mild lower extremity edema.  Abdomen is soft nontender with active bowel sounds.  GU-initially appeared to have some suprapubic tenderness on exam also appeared to still have some bleeding from his penis on initial exam this was bright red blood-however on reexamination this slowed significantly appeared to still have some intermittent residual bleeding --but appeared to have largely stopped-I did not note any swelling of the scrotum penis -appear to have some suprapubic pain without distention.  Muscle skeletal continuous arthritic changes of his knees bilaterally with some mild baseline edema with limited range of motion of both knees which is baseline he has a history of contractures.  Neurologic appeared to be at baseline I did not see any lateralizing findings speech is clear.  Psych he is oriented to self only did follow simple verbal commands this is his baseline.  Labs.  12/31/2012.  WBC 7.5 hemoglobin 11.6 platelets 112.  Sodium 142 potassium 4.1 BUN  19 creatinine 1.04.  Assessment and plan.  #1-hematuria-patient essentially had been under comfort measures here for some time-I did speak with his responsible party via phone-we had an extensive discussion and decision was made to keep patient in facility with emphasis on  comfort--family is agreeable with obtaining labs tomorrow as well as obtaining a urine culture and sensitivity-also will start an antibiotic Rocephin 1 g IM daily empirically after the urine analysis is obtained.  Also will monitor vital signs were pulse ox every 4 hours x2 and then every shif  Also encourage fluids strongly.  Will obtain a CBC a metabolic panel tomorrow I did reevaluate patient  Any actually appear to be quite comfortable lying in bed with no pain complaints bleeding appeared to have subsided.  Will also discontinue aspirin secondary to the bleeding.  #2 --Parkinson's disease this appears to be stable on Sinemet does not appear to have tremor this evening although certainly does have this at times.  #3 BPH-he is on Flomax and Avodart--she does have a history of urinary issues as noted above.  #4 history of osteoarthritic pain this appears to be stable on the oxycodone.  #5-history of chronic anemia with thrombocytopenia we will update labs family in the past does not really wish for a lot of labs here but okay with obtaining labs tomorrow morning .  History of anxiety this appears stable on Klonopin routine at night and Ativan when necessary during the day he is followed by psychiatric services.  #7-history of DVT-he is on aspirin this has been discontinued however secondary to the bleeding.  #8 past history of abdominal aortic aneurysm-again emphasis on comfort care so there has not really been aggressive followup for this per family wishes.  #9-failure thrive-weight has been relatively stable he has slowly lost some weight I suspect this is secondary to progressing dementia he is on supplementation including the prostat once a day Magic cup twice a day--family has expressed desire for mainly comfort care measures   [id number]-of note greater than 45 minutes spent assessing patient-discussing his status with responsible party via phone as well as with nursing staff-and  coordinating and formulating a plan of care-of note greater than 50% of time spent coordinating plan of care-- .

## 2013-04-23 ENCOUNTER — Other Ambulatory Visit: Payer: Self-pay | Admitting: *Deleted

## 2013-04-23 MED ORDER — OXYCODONE HCL 5 MG PO TABS: ORAL_TABLET | ORAL | Status: DC

## 2013-04-23 NOTE — Telephone Encounter (Signed)
[facility] 

## 2013-05-20 ENCOUNTER — Other Ambulatory Visit: Payer: Self-pay | Admitting: *Deleted

## 2013-05-20 MED ORDER — OXYCODONE HCL 5 MG PO TABS: ORAL_TABLET | ORAL | Status: DC

## 2013-05-20 NOTE — Telephone Encounter (Signed)
[facility] 

## 2013-06-13 ENCOUNTER — Other Ambulatory Visit: Payer: Self-pay | Admitting: *Deleted

## 2013-06-13 ENCOUNTER — Non-Acute Institutional Stay (SKILLED_NURSING_FACILITY): Payer: Medicare Other | Admitting: Internal Medicine

## 2013-06-13 ENCOUNTER — Encounter: Payer: Self-pay | Admitting: Internal Medicine

## 2013-06-13 DIAGNOSIS — D696 Thrombocytopenia, unspecified: Secondary | ICD-10-CM

## 2013-06-13 DIAGNOSIS — M1710 Unilateral primary osteoarthritis, unspecified knee: Secondary | ICD-10-CM

## 2013-06-13 DIAGNOSIS — I714 Abdominal aortic aneurysm, without rupture, unspecified: Secondary | ICD-10-CM

## 2013-06-13 DIAGNOSIS — R319 Hematuria, unspecified: Secondary | ICD-10-CM

## 2013-06-13 DIAGNOSIS — IMO0002 Reserved for concepts with insufficient information to code with codable children: Secondary | ICD-10-CM

## 2013-06-13 DIAGNOSIS — I82409 Acute embolism and thrombosis of unspecified deep veins of unspecified lower extremity: Secondary | ICD-10-CM

## 2013-06-13 DIAGNOSIS — F0390 Unspecified dementia without behavioral disturbance: Secondary | ICD-10-CM

## 2013-06-13 MED ORDER — CLONAZEPAM 0.5 MG PO TABS: ORAL_TABLET | ORAL | Status: DC

## 2013-06-13 NOTE — Telephone Encounter (Signed)
[facility] 

## 2013-06-13 NOTE — Progress Notes (Signed)
Patient ID: Jeremiah Johnson, male   DOB: 10/11/1918, 80 y.o.   MRN: [id number]   This is an acute visit/routine visit.  Level of care skilled.  [facility].   Chief complaint-acute visit secondary to hematuria-medical management of chronic medical conditions including Parkinson's disease osteoarthritis pain thrombocytopenia history of aortic abdominal aneurysm BPH depression .  History of present illness.  Patient is a pleasant 80 year old male with the above diagnoses is generally stable--approximately 3 months ago he did have an episode of gross hematuria his aspirin has been discontinued and he was treated for UTI-this appears to be stable his hemoglobin remained stable he does have some history of chronic thrombocytopenia--but this has been stable for a while as well.  He is under essentially comfort measures with minimal labs or invasive procedures per discussion with his responsible party .  He does have a history of BPH and urinary tract infections in the past.  He is on Flomax as well as Avodart.  This appears to be stable. He has a significant history of osteoarthritis especially of his knees at this appears stable on oxycodone 4 times a day   .  In regard to his other issues these appear to be fairly baseline.  He continues on Sinemet for a history of Parkinson's this has been stable for some time.  Also a history of esophageal dysphagia on protonix this has been stable.  He does have a history of anxiety i on Klonopin as well as Ativan when necessary- -.  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  Med[facility].   Review of systems.  Limited secondary to dementia--nursing staff did not report any recent issues and he is not complaining of any significant pain today or shortness of breath or chest pain i .  Physical exam . Temperature 97.9  pulse 56 respirations 22 blood pressure 118/77 weight 137.4   In general this is a frail elderly male in no distress --sitting comfortably in his chair   Eyes pupils appear equal round react to light sclera and conjunctiva are clear visual acuity appears grossly intact.  Oropharynx clear mucous membranes moist.  Chest is clear to auscultation there is no labored breathing.  Heart is regular rate and rhythm--slightly bradycardic- without murmur gallop or rub he has baseline mild lower extremity edema--is  fairly mild.  Abdomen is soft nontender with active bowel sounds.  GU-i   Muscle skeletal continuous arthritic changes of his knees bilaterally with some mild baseline edema with limited range of motion of both knees which is baseline he has a history of contractures.  Neurologic appeared to be at baseline I did not see any lateralizing findings speech is clear.  Psych he is oriented to self only did follow simple verbal commands this is his baseline.   Labs 03/16/2010.  WBC 6.7 hemoglobin 11.2 platelets 105.  Sodium 141 potassium 4.2 BUN 15 creatinine 0.96.  12/31/2012.  WBC 7.5 hemoglobin 11.6 platelets 112.  Sodium 142 potassium 4.1 BUN 19 creatinine 1.04 .  Assessment and plan.  #1-hematuria-patient essentially had been under comfort measures here for some time-there has been no reoccurrence -- labs done at that time were stable family does not wish aggressive measures Aspirin was discontinued secondary to the bleeding . Marland Kitchen.  #2 --Parkinson's disease this appears to be stable on Sinemet does not appear to have  tremor this evening although certainly does have this at times.  #3 BPH-he is on Flomax and Avodart--she does have a history of urinary issues as noted above.  #4 history of osteoarthritic pain this appears to be stable on the oxycodone.  #5-history of chronic anemia with thrombocytopenia--this appears to have been stable again limited lab secondary to comfort care  # 6 .    History of anxiety this appears stable on Klonopin routine at night and Ativan when necessary during the day he is followed by psychiatric services.  #7-history of DVT-clinically appears stable aspirin was discontinued however secondary to the bleeding.  #8 past history of abdominal aortic aneurysm-again emphasis on comfort care so there has not really been aggressive followup for this per family wishes.  #9-failure thrive with hx dementia--- he has slowly lost some weight--about 9 pounds since December I suspect this is secondary to progressing dementia he is on supplementation including the prostat once a day as well as health shake once a day Magic cup twice a day--family has expressed desire for mainly comfort care measures--   [phone]

## 2013-06-19 IMAGING — CR DG CHEST 1V
1 series · 1 of 1 positions shown · non-contrast
Comparison: 10/18/2009.

CLINICAL DATA: Fever And cough with dementia.

CHEST - 1 VIEW

[view not recorded]
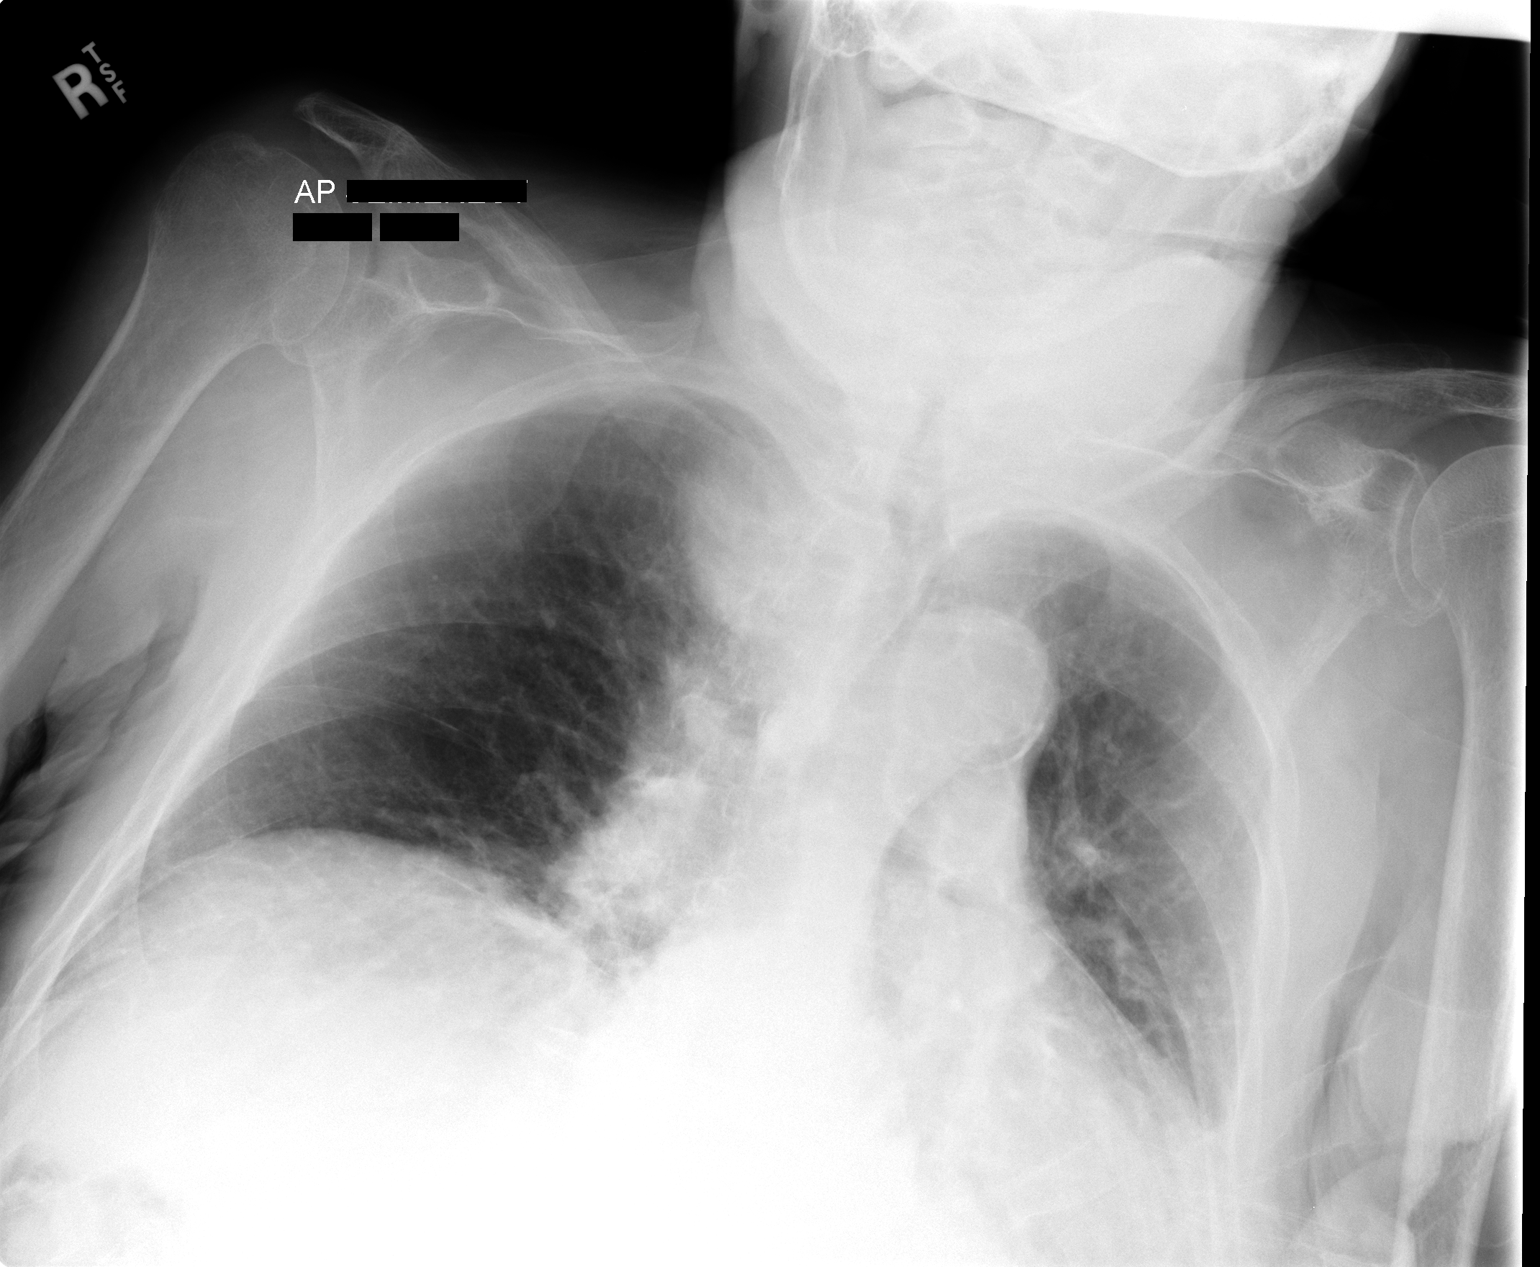

[1 of 1 positions shown; findings below may reference images not displayed]

FINDINGS: Positioning is suboptimal due to the patient's inability
to cooperate.

The heart is enlarged.  There is retrocardiac density which it is
incompletely evaluated. Left lower lobe infiltrate not excluded.
Hiatal hernia is noted.  Calcified tortuous aorta.  Tortuous
brachiocephalic vessels.
IMPRESSION: Retrocardiac density is incompletely evaluated due to the patient's
inability to remain upright.  Early left lower lobe infiltrate not
excluded.  Cardiomegaly.

## 2013-07-04 ENCOUNTER — Other Ambulatory Visit: Payer: Self-pay | Admitting: *Deleted

## 2013-07-04 MED ORDER — LORAZEPAM 0.5 MG PO TABS: ORAL_TABLET | ORAL | Status: DC

## 2013-07-04 NOTE — Progress Notes (Signed)
Prescription faxed to [facility].

## 2013-07-30 ENCOUNTER — Other Ambulatory Visit: Payer: Self-pay

## 2013-07-30 MED ORDER — OXYCODONE HCL 5 MG PO TABS: ORAL_TABLET | ORAL | Status: DC

## 2013-07-30 NOTE — Telephone Encounter (Signed)
RX faxed to [facility] @ [phone]. Phone number [phone]  

## 2013-08-07 ENCOUNTER — Non-Acute Institutional Stay (SKILLED_NURSING_FACILITY): Payer: Medicare Other | Admitting: Internal Medicine

## 2013-08-07 DIAGNOSIS — F0390 Unspecified dementia without behavioral disturbance: Secondary | ICD-10-CM

## 2013-08-07 DIAGNOSIS — I714 Abdominal aortic aneurysm, without rupture, unspecified: Secondary | ICD-10-CM

## 2013-08-07 DIAGNOSIS — I82409 Acute embolism and thrombosis of unspecified deep veins of unspecified lower extremity: Secondary | ICD-10-CM

## 2013-08-07 DIAGNOSIS — M25569 Pain in unspecified knee: Secondary | ICD-10-CM

## 2013-08-07 DIAGNOSIS — D696 Thrombocytopenia, unspecified: Secondary | ICD-10-CM

## 2013-08-07 NOTE — Progress Notes (Signed)
Patient ID: Jeremiah Johnson, male   DOB: 11-Aug-1918, 80 y.o.   MRN: [id number]   This is an acute visit/routine visit.  Level of care skilled.  Facility [facility] .  Chief complaint-acute visit secondary to hematuria-medical management of chronic medical conditions including Parkinson's disease osteoarthritis pain thrombocytopenia history of aortic abdominal aneurysm BPH depression  .  History of present illness.  Patient is a pleasant 80 year old male with the above diagnoses is generally stable--approximately 5 months ago he did have an episode of gross hematuria his aspirin has been discontinued and he was treated for UTI-this appears to be stable his hemoglobin remained stable he does have some history of chronic thrombocytopenia--but this has been stable for a while as well.  He is under essentially comfort measures with minimal labs or invasive procedures per discussion with his responsible party  .  He does have a history of BPH and urinary tract infections in the past.  He is on Flomax as well as Avodart.  This appears to be stable.  He has a significant history of osteoarthritis especially of his knees at this appears stable on oxycodone 4 times a day  .  In regard to his other issues these appear to be fairly baseline.  He continues on Sinemet for a history of Parkinson's this has been stable for some time.  Also a history of esophageal dysphagia on protonix this has been stable.  He does have a history of anxiety i on Klonopin as well as Ativan when necessary-  Today he has no complaints as well as signs remained stable--he has gained some weight-- appears about 5 pounds over the past couple months -.  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  Medications have been reviewed per [facility].   Review of systems.  Limited secondary to dementia--nursing staff did not report any recent  issues and he is not complaining of any significant pain today or shortness of breath or chest pain i  .  Physical exam  Temperature 97.2 pulse 62 respirations 20 blood pressure 122/67-126/69-weight is 155  .  In general this is a frail elderly male in no distress --sitting comfortably in his wheelchair  Eyes pupils appear equal round react to light sclera and conjunctiva are clear visual acuity appears grossly intact.  Oropharynx clear mucous membranes moist.  Chest is clear to auscultation there is no labored breathing.  Heart is regular rate and rhythm--s- without murmur gallop or rub he has baseline mild lower extremity edema--is fairly mild.  Abdomen is soft nontender with active bowel sounds.  GU-i  Muscle skeletal  arthritic changes of his knees bilaterally with some mild baseline edema with limited range of motion of both knees which is baseline he has a history of contractures.  Neurologic appeared to be at baseline I did not see any lateralizing findings speech is clear.  Psych he is oriented to self only did follow simple verbal commands this is his baseline.   Labs  03/16/2010.  WBC 6.7 hemoglobin 11.2 platelets 105.  Sodium 141 potassium 4.2 BUN 15 creatinine 0.96.  12/31/2012.  WBC 7.5 hemoglobin 11.6 platelets 112.  Sodium 142 potassium 4.1 BUN 19 creatinine 1.04  .  Assessment and plan.   #1-hematuria-patient essentially had been under comfort measures here for some time-there has been no reoccurrence -- labs done at that time were stable family does  not wish aggressive measures  Aspirin was discontinued secondary to the bleeding--will update CBC and metabolic panel if family is okay with this  . Marland Kitchen.  #2 --Parkinson's disease this appears to be stable on Sinemet does not appear to have tremor this evening although certainly does have this at times.  #3 BPH-he is on Flomax and Avodart--she does have a history of urinary issues as noted above .  #4 history of  osteoarthritic pain this appears to be stable on the oxycodone .  #5-history of chronic anemia with thrombocytopenia--this appears to have been stable again limited lab secondary to comfort care--we'll update labs if responsible party is okay with this  # 6 .  History of anxiety this appears stable on Klonopin routine at night and Ativan when necessary during the day he is followed by psychiatric services.  #7-history of DVT-clinically appears stable aspirin was discontinued however secondary to the bleeding.  #8 past history of abdominal aortic aneurysm-again emphasis on comfort care so there has not really been aggressive followup for this per family wishes.  #9-failure thrive with hx dementia--- he actually has gained some weight tear which is encouraging appears to be stable in this regards past although certainly at risk with his history of dementia and comorbidities  [phone]

## 2013-08-11 ENCOUNTER — Encounter: Payer: Self-pay | Admitting: Internal Medicine

## 2013-09-11 ENCOUNTER — Other Ambulatory Visit: Payer: Self-pay | Admitting: *Deleted

## 2013-09-11 MED ORDER — OXYCODONE HCL 5 MG PO TABS: ORAL_TABLET | ORAL | Status: DC

## 2013-09-11 NOTE — Telephone Encounter (Signed)
[facility] 

## 2013-10-14 ENCOUNTER — Non-Acute Institutional Stay (SKILLED_NURSING_FACILITY): Payer: Medicare Other | Admitting: Internal Medicine

## 2013-10-14 DIAGNOSIS — A46 Erysipelas: Secondary | ICD-10-CM

## 2013-10-17 NOTE — Progress Notes (Addendum)
Patient ID: Jeremiah Johnson, male   DOB: 11/20/1918, 80 y.o.   MRN: [id number]               PROGRESS NOTE  DATE:  10/14/2013    FACILITY: [facility]      LEVEL OF CARE:   SNF   Acute Visit   CHIEF COMPLAINT:   "Rash" on his face.    HISTORY OF PRESENT ILLNESS:  I was asked to look at Jeremiah Johnson, who is a longstanding resident in this facility, initially being admitted here in 2013 or perhaps even before that.      He was noted by the staff to have erythema over the right maxillary sinus area and I was asked to see him because of this.  The patient does not really talk about pain, nor is he running a fever.    PHYSICAL EXAMINATION:   HEENT:   HEAD/FACE:  Indeed, there is a well demarcated, warm, erythematous area over the right maxillary sinus area.  This is tender.    MOUTH/THROAT:   Oral exam shows that he is edentulous.  He has upper dentures.  I do not see anything particularly ominous in the upper right jaw.   LYMPHATICS:  None palpable in the cervical area.     ASSESSMENT/PLAN:  Erysipelas.  I think this is the correct diagnosis.  I had originally considered whether he might have underlying sinusitis or some form of dental abscess, although I do not think either one of these is the case.  He does not have nasal congestion or purulent nasal drainage.  The demarcation, which is abrupt between involved skin and uninvolved skin, makes me think that this is more likely to be a superficial cellulitis/erysipelas.  I have started him on Augmentin.

## 2013-10-30 ENCOUNTER — Other Ambulatory Visit: Payer: Self-pay | Admitting: *Deleted

## 2013-10-30 MED ORDER — OXYCODONE HCL 5 MG PO TABS: ORAL_TABLET | ORAL | Status: DC

## 2013-10-30 NOTE — Telephone Encounter (Signed)
[facility] 

## 2013-11-19 ENCOUNTER — Encounter: Payer: Self-pay | Admitting: *Deleted

## 2013-12-09 ENCOUNTER — Other Ambulatory Visit: Payer: Self-pay

## 2013-12-09 MED ORDER — CLONAZEPAM 0.5 MG PO TABS: ORAL_TABLET | ORAL | Status: DC

## 2013-12-09 NOTE — Telephone Encounter (Signed)
RX faxed to [facility] @ [phone]. Phone number [phone]  

## 2013-12-17 ENCOUNTER — Other Ambulatory Visit: Payer: Self-pay

## 2013-12-17 MED ORDER — OXYCODONE HCL 5 MG PO TABS: ORAL_TABLET | ORAL | Status: DC

## 2013-12-17 NOTE — Telephone Encounter (Signed)
RX faxed to [facility] @ [phone]. Phone number [phone]  

## 2014-01-20 ENCOUNTER — Other Ambulatory Visit: Payer: Self-pay | Admitting: *Deleted

## 2014-01-20 MED ORDER — LORAZEPAM 0.5 MG PO TABS: ORAL_TABLET | ORAL | Status: DC

## 2014-01-20 MED ORDER — OXYCODONE HCL 5 MG PO TABS: ORAL_TABLET | ORAL | Status: DC

## 2014-01-29 ENCOUNTER — Other Ambulatory Visit: Payer: Self-pay | Admitting: *Deleted

## 2014-01-29 MED ORDER — OXYCODONE HCL 5 MG PO TABS: ORAL_TABLET | ORAL | Status: DC

## 2014-01-29 NOTE — Telephone Encounter (Signed)
[facility] 

## 2014-02-18 ENCOUNTER — Encounter: Payer: Self-pay | Admitting: Internal Medicine

## 2014-02-18 ENCOUNTER — Non-Acute Institutional Stay (SKILLED_NURSING_FACILITY): Payer: Medicare Other | Admitting: Internal Medicine

## 2014-02-18 DIAGNOSIS — I714 Abdominal aortic aneurysm, without rupture, unspecified: Secondary | ICD-10-CM

## 2014-02-18 DIAGNOSIS — M25569 Pain in unspecified knee: Secondary | ICD-10-CM

## 2014-02-18 DIAGNOSIS — D696 Thrombocytopenia, unspecified: Secondary | ICD-10-CM

## 2014-02-18 DIAGNOSIS — I82409 Acute embolism and thrombosis of unspecified deep veins of unspecified lower extremity: Secondary | ICD-10-CM

## 2014-02-18 DIAGNOSIS — G20 Parkinson's disease: Secondary | ICD-10-CM

## 2014-02-18 DIAGNOSIS — R627 Adult failure to thrive: Secondary | ICD-10-CM

## 2014-02-18 DIAGNOSIS — K222 Esophageal obstruction: Secondary | ICD-10-CM

## 2014-02-18 NOTE — Progress Notes (Signed)
Patient ID: Jeremiah Johnson, male   DOB: 11/01/1918, 81 y.o.   MRN: [id number]   This is a routine visit.  Level of care skilled.  [facility] .  Chief complaint-acute visit secondary to hematuria-medical management of chronic medical conditions including Parkinson's disease osteoarthritis pain thrombocytopenia history of aortic abdominal aneurysm BPH depression  .  History of present illness.  Patient is a pleasant 81 year old male with the above diagnoses is generally stable--Last year  he did have an episode of gross hematuria his aspirin has been discontinued and he was treated for UTI-this appears to be stable his hemoglobin remained stable he does have some history of chronic thrombocytopenia--but this has been stable for a while as well.  He is under essentially comfort measures with minimal labs or invasive procedures per discussion with his responsible party  .  He does have a history of BPH and urinary tract infections in the past.  He is on Flomax as well as Avodart.  This appears to be stable.  He has a significant history of osteoarthritis especially of his knees at this appears stable on oxycodone 4 times a day  .  In regard to his other issues these appear to be fairly baseline.  He continues on Sinemet for a history of Parkinson's this has been stable for some time.  Also a history of esophageal dysphagia on protonix this has been stable.  He does have a history of anxiety i on Klonopin as well as Ativan when necessary-  Today he has no complaints as well as signs remained stable-  I see his last listed weight is 133 which if accurate would be lost about 20 pounds over the past 6 months although I wonder if there is some scale accuracy issues here will write an order to have this evaluated to see what the true situation is-he is generally frail but does not appear to have lost  That much weight--appetite a---ppears to run about 50% of meals--seems does quite a bit of  variability -.  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  [facility]   Review of systems.  Limited secondary to dementia--nursing staff did not report any recent issues and he is not complaining of any significant pain today or shortness of breath or chest pain i  .  Physical exam  Temperature 98.0 pulse 63 respirations 20 blood pressure 125/55-127/61 most recently  .  In general this is a frail elderly male in no distress --sitting comfortably in his wheelchair  Eyes pupils appear equal round react to light sclera and conjunctiva are clear visual acuity appears grossly intact.  Oropharynx clear mucous membranes moist.  Chest is clear to auscultation there is no labored breathing.  Heart is regular rate and rhythm--s- without murmur gallop or rub he has baseline mild lower extremity edema--is fairly mild.  Abdomen is soft nontender with active bowel sounds.  GU-i  Muscle skeletal  arthritic changes of his knees bilaterally with some mild baseline edema with limited range of motion of both knees which is baseline he has a history of contractures.  Neurologic appeared to be at baseline I did not see any lateralizing findings speech is clear.  Psych he is oriented to self only did follow simple verbal commands this is his baseline.   Labs   11/11/2013.  WBC 7.7 hemoglobin 12.6  platelets 147.  Sodium 139 potassium 4.1 BUN 17 creatinine 1.00   03/16/2010.  WBC 6.7 hemoglobin 11.2 platelets 105.  Sodium 141 potassium 4.2 BUN 15 creatinine 0.96.  12/31/2012.  WBC 7.5 hemoglobin 11.6 platelets 112.  Sodium 142 potassium 4.1 BUN 19 creatinine 1.04    .  Assessment and plan.   #1-hematuria-patient essentially had been under comfort measures here for some time-there has been no reoccurrence -- labs done at that time were stable family does  not wish aggressive measures  Aspirin was discontinued secondary to the bleeding--will update CBC and metabolic panel if family is okay with this  . Marland Kitchen  #2 --Parkinson's disease this appears to be stable on Sinemet does not appear to have tremor this evening although certainly does have this at times.  #3 BPH-he is on Flomax and Avodart--she does have a history of urinary issues as noted above .  #4 history of osteoarthritic pain this appears to be stable on the oxycodone .  #5-history of chronic anemia with thrombocytopenia--this appears to have been stable again limited lab secondary to comfort care--we'll update labs if responsible party is okay with this  # 6 .  History of anxiety this appears stable on Klonopin routine at night and Ativan when necessary during the day he is followed by psychiatric services.  #7-history of DVT-clinically appears stable aspirin was discontinued however secondary to the bleeding.  #8 past history of abdominal aortic aneurysm-again emphasis on comfort care so there has not really been aggressive followup for this per family wishes.  #9-failure thrive with hx dementia--- again most recent weight is accurate compared to other weights he has lost some weight-will write an order to reweigh and have dietary look at this this is a true weight loss I suspect we will be readdressing-again will update labs including a metabolic panel abdomen if family is okay with this-again they are quite conservative with emphasis on comfort  certainly at risk with his history of dementia and comorbidities--he is on Magic cup supplementation  [id number]

## 2014-02-28 ENCOUNTER — Other Ambulatory Visit: Payer: Self-pay | Admitting: *Deleted

## 2014-02-28 MED ORDER — OXYCODONE HCL 5 MG PO TABS: ORAL_TABLET | ORAL | Status: DC

## 2014-02-28 NOTE — Telephone Encounter (Signed)
[facility] 

## 2014-03-19 ENCOUNTER — Non-Acute Institutional Stay (SKILLED_NURSING_FACILITY): Payer: Medicare Other | Admitting: Internal Medicine

## 2014-03-19 ENCOUNTER — Encounter: Payer: Self-pay | Admitting: Internal Medicine

## 2014-03-19 DIAGNOSIS — F0390 Unspecified dementia without behavioral disturbance: Secondary | ICD-10-CM

## 2014-03-19 DIAGNOSIS — M1710 Unilateral primary osteoarthritis, unspecified knee: Secondary | ICD-10-CM

## 2014-03-19 DIAGNOSIS — M179 Osteoarthritis of knee, unspecified: Secondary | ICD-10-CM | POA: Diagnosis not present

## 2014-03-19 DIAGNOSIS — D696 Thrombocytopenia, unspecified: Secondary | ICD-10-CM

## 2014-03-19 DIAGNOSIS — IMO0002 Reserved for concepts with insufficient information to code with codable children: Secondary | ICD-10-CM

## 2014-03-19 DIAGNOSIS — I714 Abdominal aortic aneurysm, without rupture, unspecified: Secondary | ICD-10-CM

## 2014-03-19 DIAGNOSIS — G20 Parkinson's disease: Secondary | ICD-10-CM

## 2014-03-19 NOTE — Progress Notes (Signed)
Patient ID: Jeremiah Johnson, male   DOB: 1918/02/25, 81 y.o.   MRN: [id number]   his is a routine visit.  Level of care skilled.  Facility [facility] .  Chief complaintmedical management of chronic medical conditions including Parkinson's disease osteoarthritis pain thrombocytopenia history of aortic abdominal aneurysm BPH depression  .  History of present illness.  Patient is a pleasant 81 year old male with the above diagnoses is generally stable--Last year  he did have an episode of gross hematuria his aspirin has been discontinued and he was treated for UTI-this appears to be stable his hemoglobin remained stable he does have some history of chronic thrombocytopenia--but this has been stable for a while as well.  He is under essentially comfort measures with minimal labs or invasive procedures per discussion with his responsible party  .  He does have a history of BPH and urinary tract infections in the past.  He is on Flomax as well as Avodart.  This appears to be stable.  He has a significant history of osteoarthritis especially of his knees at this appears stable on oxycodone 4 times a day  .  In regard to his other issues these appear to be fairly baseline.  He continues on Sinemet for a history of Parkinson's this has been stable for some time.  Also a history of esophageal dysphagia on protonix this has been stable.  He does have a history of anxiety i on Klonopin as well as Ativan when necessary-  Today he has no complaints as well as signs remained stable-  I see his last listed weight is 133 stable with last month's weight although lost about 20 pounds over the past 6 or 7 months-it appears his appetite does fluctuate-he is on numerous supplements including Magic cup and health shakes again it appears to have stabilized for now  -.  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    Medications have been reviewed per [facility]   Review of systems.  Limited secondary to dementia--nursing staff did not report any recent issues and he is not complaining of any significant pain today or shortness of breath or chest pain i  .  Physical exam  Temperature is 98.6 pulse 60 respirations 20 blood pressure 119/61 all these appear relatively baseline his weight is 133 which is stable with last month's weight   .  In general this is a frail elderly male in no distress --sitting comfortably in his wheelchair  Eyes pupils appear equal round react to light sclera and conjunctiva are clear visual acuity appears grossly intact.  Oropharynx clear mucous membranes moist.  Chest is clear to auscultation there is no labored breathing.  Heart is regular rate and rhythm--s- without murmur gallop or rub he has baseline mild lower extremity edema--is quite mild.  Abdomen is soft nontender with active bowel sounds.  GU-  Muscle skeletal  arthritic changes of his knees bilaterally with some mild baseline edema with limited range of motion of both knees which is baseline he has a history of contractures--he actually is ambulating quite well in his wheelchair.  Neurologic appeared to be at baseline I did not see any lateralizing findings speech is clear.  Psych he is oriented to self only did follow simple verbal commands this is his baseline.   Labs     11/11/2013.  WBC 7.7 hemoglobin 12.6 platelets 147.  Sodium 139 potassium 4.1 BUN 17 creatinine 1.00   03/16/2010.  WBC 6.7 hemoglobin 11.2 platelets 105.  Sodium 141 potassium 4.2 BUN 15 creatinine 0.96.  12/31/2012.  WBC 7.5 hemoglobin 11.6 platelets 112.  Sodium 142 potassium 4.1 BUN 19 creatinine 1.04    .  Assessment and plan.   #1-hematuria-patient essentially had been under comfort measures here for some time-there has been no reoccurrence -- labs done at that time were stable family does not wish aggressive measures  Aspirin  was discontinued secondary to the bleeding--  . Marland Kitchen.  #2 --Parkinson's disease this appears to be stable on Sinemet does not appear to have tremor this evening although certainly does have this at times.  #3 BPH-he is on Flomax and Avodart--she does have a history of urinary issues as noted above .  #4 history of osteoarthritic pain this appears to be stable on the oxycodone .  #5-history of chronic anemia with thrombocytopenia--this appears to have been stable again limited lab secondary to comfort care-  # 6 .  History of anxiety this appears stable on Klonopin routine at night and Ativan when necessary during the day he is followed by psychiatric services.  #7-history of DVT-clinically appears stable aspirin was discontinued however secondary to the bleeding.  #8 past history of abdominal aortic aneurysm-again emphasis on comfort care so there has not really been aggressive followup for this per family wishes.  #9-failure thrive with hx dementia--- Suspect this will continue to be an issue he continues on supplements including Magic cup and health shakes at this point appears to be stable weight has stabilized  [id number]

## 2014-03-31 ENCOUNTER — Other Ambulatory Visit: Payer: Self-pay | Admitting: *Deleted

## 2014-03-31 MED ORDER — OXYCODONE HCL 5 MG PO TABS: ORAL_TABLET | ORAL | Status: DC

## 2014-04-21 ENCOUNTER — Other Ambulatory Visit: Payer: Self-pay | Admitting: *Deleted

## 2014-04-21 MED ORDER — OXYCODONE HCL 5 MG/5ML PO SOLN: 5.0000 mg | ORAL | Status: DC | PRN

## 2014-05-02 ENCOUNTER — Other Ambulatory Visit: Payer: Self-pay | Admitting: *Deleted

## 2014-05-02 MED ORDER — OXYCODONE HCL 5 MG PO TABS: ORAL_TABLET | ORAL | Status: DC

## 2014-05-02 NOTE — Telephone Encounter (Signed)
[facility] 

## 2014-05-05 ENCOUNTER — Other Ambulatory Visit: Payer: Self-pay | Admitting: *Deleted

## 2014-05-05 MED ORDER — OXYCODONE HCL 5 MG/5ML PO SOLN: 5.0000 mg | ORAL | Status: DC | PRN

## 2014-05-05 NOTE — Telephone Encounter (Signed)
[facility] 

## 2014-05-08 ENCOUNTER — Other Ambulatory Visit: Payer: Self-pay | Admitting: *Deleted

## 2014-05-08 MED ORDER — OXYCODONE HCL 5 MG/5ML PO SOLN: 5.0000 mg | ORAL | Status: DC | PRN

## 2014-05-08 NOTE — Telephone Encounter (Signed)
[facility] 

## 2014-06-02 ENCOUNTER — Other Ambulatory Visit: Payer: Self-pay

## 2014-06-02 MED ORDER — OXYCODONE HCL 5 MG PO TABS: ORAL_TABLET | ORAL | Status: DC

## 2014-06-02 NOTE — Telephone Encounter (Signed)
RX faxed to [facility] @ [phone]. Phone number [phone]  

## 2014-06-19 ENCOUNTER — Other Ambulatory Visit: Payer: Self-pay | Admitting: *Deleted

## 2014-06-19 MED ORDER — OXYCODONE HCL 5 MG PO TABS: ORAL_TABLET | ORAL | Status: DC

## 2014-06-19 NOTE — Telephone Encounter (Signed)
[facility]  

## 2014-06-21 ENCOUNTER — Non-Acute Institutional Stay (SKILLED_NURSING_FACILITY): Payer: Medicare Other | Admitting: Internal Medicine

## 2014-06-21 DIAGNOSIS — M1710 Unilateral primary osteoarthritis, unspecified knee: Secondary | ICD-10-CM

## 2014-06-21 DIAGNOSIS — I714 Abdominal aortic aneurysm, without rupture, unspecified: Secondary | ICD-10-CM

## 2014-06-21 DIAGNOSIS — IMO0002 Reserved for concepts with insufficient information to code with codable children: Secondary | ICD-10-CM

## 2014-06-21 DIAGNOSIS — M179 Osteoarthritis of knee, unspecified: Secondary | ICD-10-CM

## 2014-06-21 DIAGNOSIS — R627 Adult failure to thrive: Secondary | ICD-10-CM | POA: Diagnosis not present

## 2014-06-21 DIAGNOSIS — G20 Parkinson's disease: Secondary | ICD-10-CM | POA: Diagnosis not present

## 2014-06-21 NOTE — Progress Notes (Signed)
Patient ID: Jeremiah EckRobert E Meinecke, male   DOB: 02/01/1918, 81 y.o.   MRN: [id number]       this is a routine visit.  Level of care skilled.  [facility] .  Chief complain--tmedical management of chronic medical conditions including Parkinson's disease osteoarthritis pain thrombocytopenia history of aortic abdominal aneurysm BPH depression  .  History of present illness.  Patient is a pleasant 81 year old male with the above diagnoses is generally stable-although appears to be gradually declining --Last year  he did have an episode of gross hematuria his aspirin has been discontinued and he was treated for UTI-this appears to be stable his hemoglobin remained stable he does have some history of chronic thrombocytopenia--but this has been stable for a while as well.  He is under essentially comfort measures with minimal labs or invasive procedures per discussion with his responsible party - .  He does have a history of BPH and urinary tract infections in the past.  He is on Flomax as well as Avodart.  This appears to be stable.  He has a significant history of osteoarthritis especially of his knees at this appears stable on oxycodone   .  In regard to his other issues these appear to be fairly baseline.  He continues on Sinemet for a history of Parkinson's this has been stable for some time although again he does appear to be gradually declining.  Also a history of esophageal dysphagia on protonix this has been stable.  He does have a history of anxiety i on Klonopin as well as Ativan when necessary-  Today he has no complaints as well --vital signs remained stable-  I see his last listed weight is 129 --has lost about 25 pounds over the past 10 months-it appears his appetite does fluctuate-he is on numerous supplements --loss appears to have stabilized somewhat the last few months-down about 3 or 4 pounds over the past 3 months  -.  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  [facility]   Review of systems.  Limited secondary to dementia--nursing staff did not report any recent issues and he is not complaining of any significant pain today or shortness of breath or chest pain i  .  Physical exam   Enter 97.8 pulse 60 respirations 20 blood pressure 101/54 weight is 129   .  In general this is a frail elderly male in no distress --and comfortably in bed  Eyes pupils appear equal round react to light sclera and conjunctiva are clear visual acuity appears grossly intact.  Oropharynx clear mucous membranes moist.  Chest is clear to auscultation there is no labored breathing.  Heart is regular rate and rhythm--s- without murmur gallop or rub he has baseline mild lower extremity edema--is quite mild.  Abdomen is soft nontender with active bowel sounds.  GU-  Muscle skeletal  arthritic changes of his knees bilaterally with some mild baseline edema with limited range of motion of both knees which is baseline he has a history of contractures--he actually is ambulating quite well in his wheelchair.  Neurologic appeared to be at baseline I did not see any lateralizing findings speech is clear.  Psych he is oriented to self only did follow simple verbal commands this is his baseline.   Labs     11/11/2013.  WBC 7.7 hemoglobin 12.6 platelets 147.  Sodium 139 potassium 4.1 BUN 17 creatinine 1.00   03/16/2010.  WBC 6.7 hemoglobin 11.2 platelets 105.  Sodium 141 potassium 4.2 BUN 15 creatinine 0.96.  12/31/2012.  WBC 7.5 hemoglobin 11.6 platelets 112.  Sodium 142 potassium 4.1 BUN 19 creatinine 1.04    .  Assessment and plan.   #1-hematuria-patient essentially had been under comfort measures here for some time-there has been no reoccurrence -- labs done at that time were stable family does not wish aggressive measures  Aspirin was  discontinued secondary to the bleeding--  . Marland Kitchen  #2 --Parkinson's disease this appears to be stable on Sinemet does not appear to have tremor this evening although certainly does have this at times--he is having a gradual decline which is not surprising .  #3 BPH-he is on Flomax and Avodart--he does have a history of urinary issues as noted above .  #4 history of osteoarthritic pain this appears to be stable on the oxycodone .  #5-history of chronic anemia with thrombocytopenia--this appears to have been stable again limited lab secondary to comfort care-  # 6 .  History of anxiety this appears stable on Klonopin routine at night and Ativan when necessary during the day he is followed by psychiatric services.  #7-history of DVT-clinically appears stable aspirin was discontinued however secondary to the bleeding.  #8 past history of abdominal aortic aneurysm-again emphasis on comfort care so there has not really been aggressive followup for this per family wishes.  #9-failure thrive with hx dementia--- Suspect this will continue to be an issue he continues on supplements --again the emphasis is on supportive care weight loss appears to have stabilized somewhat over the past few months i   [id number]

## 2014-06-22 ENCOUNTER — Encounter: Payer: Self-pay | Admitting: Internal Medicine

## 2014-07-01 ENCOUNTER — Other Ambulatory Visit: Payer: Self-pay | Admitting: *Deleted

## 2014-07-01 MED ORDER — OXYCODONE HCL 5 MG PO TABS: ORAL_TABLET | ORAL | Status: DC

## 2014-07-18 ENCOUNTER — Other Ambulatory Visit: Payer: Self-pay | Admitting: *Deleted

## 2014-07-18 MED ORDER — OXYCODONE HCL 5 MG PO TABS: ORAL_TABLET | ORAL | Status: DC

## 2014-07-18 NOTE — Telephone Encounter (Signed)
[facility] 

## 2014-07-29 ENCOUNTER — Other Ambulatory Visit: Payer: Self-pay

## 2014-07-29 MED ORDER — LORAZEPAM 0.5 MG PO TABS: ORAL_TABLET | ORAL | Status: AC

## 2014-07-29 NOTE — Telephone Encounter (Signed)
RX faxed to [facility] @ [phone]. Phone number [phone]  

## 2014-08-28 ENCOUNTER — Encounter (HOSPITAL_COMMUNITY)
Admission: AD | Admit: 2014-08-28 | Discharge: 2014-08-28 | Disposition: A | Discharge status: Home / Self Care | Payer: Medicare Other | Source: Skilled Nursing Facility | Attending: Internal Medicine | Admitting: Internal Medicine

## 2014-08-28 DIAGNOSIS — R627 Adult failure to thrive: Secondary | ICD-10-CM | POA: Insufficient documentation

## 2014-08-28 DIAGNOSIS — R509 Fever, unspecified: Secondary | ICD-10-CM | POA: Diagnosis not present

## 2014-08-28 DIAGNOSIS — D649 Anemia, unspecified: Secondary | ICD-10-CM | POA: Insufficient documentation

## 2014-08-28 LAB — URINALYSIS, ROUTINE W REFLEX MICROSCOPIC
Bilirubin Urine: NEGATIVE
GLUCOSE, UA: NEGATIVE mg/dL
KETONES UR: NEGATIVE mg/dL
Nitrite: POSITIVE — AB
PH: 5.5 (ref 5.0–8.0)
Protein, ur: NEGATIVE mg/dL
Specific Gravity, Urine: 1.025 (ref 1.005–1.030)
UROBILINOGEN UA: 2 mg/dL — AB (ref 0.0–1.0)

## 2014-08-28 LAB — URINE MICROSCOPIC-ADD ON

## 2014-08-29 ENCOUNTER — Encounter (HOSPITAL_COMMUNITY)
Admission: AD | Admit: 2014-08-29 | Discharge: 2014-08-29 | Disposition: A | Discharge status: Home / Self Care | Payer: Medicare Other | Source: Skilled Nursing Facility | Attending: Internal Medicine | Admitting: Internal Medicine

## 2014-08-29 ENCOUNTER — Non-Acute Institutional Stay (SKILLED_NURSING_FACILITY): Payer: Medicare Other | Admitting: Internal Medicine

## 2014-08-29 DIAGNOSIS — R627 Adult failure to thrive: Secondary | ICD-10-CM | POA: Diagnosis not present

## 2014-08-29 DIAGNOSIS — R5383 Other fatigue: Secondary | ICD-10-CM | POA: Diagnosis not present

## 2014-08-29 DIAGNOSIS — R509 Fever, unspecified: Secondary | ICD-10-CM | POA: Diagnosis not present

## 2014-08-29 DIAGNOSIS — R5081 Fever presenting with conditions classified elsewhere: Secondary | ICD-10-CM | POA: Diagnosis not present

## 2014-08-29 LAB — COMPREHENSIVE METABOLIC PANEL
ALBUMIN: 3.2 g/dL — AB (ref 3.5–5.0)
ALT: 117 U/L — AB (ref 17–63)
AST: 435 U/L — ABNORMAL HIGH (ref 15–41)
Alkaline Phosphatase: 262 U/L — ABNORMAL HIGH (ref 38–126)
Anion gap: 11 (ref 5–15)
BUN: 25 mg/dL — AB (ref 6–20)
CALCIUM: 8.5 mg/dL — AB (ref 8.9–10.3)
CHLORIDE: 105 mmol/L (ref 101–111)
CO2: 25 mmol/L (ref 22–32)
Creatinine, Ser: 1.34 mg/dL — ABNORMAL HIGH (ref 0.61–1.24)
GFR calc Af Amer: 51 mL/min — ABNORMAL LOW (ref >60)
GFR calc non Af Amer: 44 mL/min — ABNORMAL LOW (ref >60)
Glucose, Bld: 138 mg/dL — ABNORMAL HIGH (ref 65–99)
Potassium: 4.9 mmol/L (ref 3.5–5.1)
SODIUM: 141 mmol/L (ref 135–145)
Total Bilirubin: 3.8 mg/dL — ABNORMAL HIGH (ref 0.3–1.2)
Total Protein: 6 g/dL — ABNORMAL LOW (ref 6.5–8.1)

## 2014-08-29 LAB — CBC WITH DIFFERENTIAL/PLATELET
BASOS ABS: 0 10*3/uL (ref 0.0–0.1)
BASOS PCT: 0 % (ref 0–1)
EOS ABS: 0 10*3/uL (ref 0.0–0.7)
Eosinophils Relative: 0 % (ref 0–5)
HCT: 34 % — ABNORMAL LOW (ref 39.0–52.0)
HEMOGLOBIN: 11.3 g/dL — AB (ref 13.0–17.0)
LYMPHS ABS: 0.6 10*3/uL — AB (ref 0.7–4.0)
Lymphocytes Relative: 4 % — ABNORMAL LOW (ref 12–46)
MCH: 31.7 pg (ref 26.0–34.0)
MCHC: 33.2 g/dL (ref 30.0–36.0)
MCV: 95.5 fL (ref 78.0–100.0)
MONOS PCT: 3 % (ref 3–12)
Monocytes Absolute: 0.4 10*3/uL (ref 0.1–1.0)
NEUTROS PCT: 94 % — AB (ref 43–77)
Neutro Abs: 16.3 10*3/uL — ABNORMAL HIGH (ref 1.7–7.7)
PLATELETS: 106 10*3/uL — AB (ref 150–400)
RBC: 3.56 MIL/uL — ABNORMAL LOW (ref 4.22–5.81)
RDW: 14 % (ref 11.5–15.5)
WBC: 17.3 10*3/uL — ABNORMAL HIGH (ref 4.0–10.5)

## 2014-09-01 LAB — URINE CULTURE: Culture: >=100000

## 2014-09-16 ENCOUNTER — Other Ambulatory Visit: Payer: Self-pay | Admitting: *Deleted

## 2014-09-16 MED ORDER — AMBULATORY NON FORMULARY MEDICATION: Status: DC

## 2014-09-16 NOTE — Telephone Encounter (Signed)
[facility] 

## 2014-09-24 ENCOUNTER — Encounter: Payer: Self-pay | Admitting: Internal Medicine

## 2014-09-24 DIAGNOSIS — R5383 Other fatigue: Secondary | ICD-10-CM | POA: Insufficient documentation

## 2014-09-24 DIAGNOSIS — R627 Adult failure to thrive: Secondary | ICD-10-CM | POA: Insufficient documentation

## 2014-09-24 NOTE — Progress Notes (Signed)
Patient ID: Jeremiah Johnson, male   DOB: [birth year], 81 y.o.   MRN: [id number]        this is an acute visit.  Level of care skilled.  Facility [facility] .  Chief complaint-acute visit secondary to lethargy change in status planning status  .  History of present illness.  Patient is a pleasant 81 year old male has been gradually declining here for several months.  He does have a history of dementia and Parkinson's disease-he has lost about 25 pounds over the past year.  Patient is under somewhat comfort measures with family desires for fairly minimal invasive procedures and labs although he has obtain these at times.  He also has a history of UTIs.  Today patient is lying in bed slightly diaphoretic not eating and drinking responding fairly minimally.  He does have a low-grade temperature of 99.3 others wise vital signs appear to be stable apparently he did have a temperature of 100.2 overnight.         -.  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  Medications have been reviewed per [facility] include.  Tylenol 650 mg every 4 hours when necessary.  Sinemet 25-100 milligrams 4 times a day.  Klonopin 0.25 mg daily at bedtime.  Ativan 0.25 mg every 6 hours when necessary.  OxyIR 5 mg every 4 hours when necessary.  Zantac 150 mg twice a day.  Flomax 0.4 mg daily.   .   Review of systems.   Not possible secondary to patient's lethargic status  .  Physical exam  Temperature 99.3 pulse 56 respirations 20 blood pressure 102/54  In general this is a frail elderly male cold up in bed he is slightly diaphoretic is responsive but talking very minimally.  His skin again is somewhat diaphoretic.  Eyes pupils do appear reactive to light.  Oropharynx difficult to tell patient would not really open his mouth.  Chest clear to auscultation with poor respiratory effort  there is no labored breathing.  Heart is regular rate and rhythm slightly bradycardic at 56 has minimal lower extremity edema.  Abdomen is soft does not appear to be acutely tender there are positive bowel sounds.  Muscle skeletal holds his lower extremities in contracted position is able to move all his extremities but appears quite weak generalize.  Neurologic as noted above appears to be quite weak I do not see any lateralizing findings however his speech is fairly minimal.  Psych as noted above he is speaking minimally does have a history of progressive dementia.  Labs.  08/29/2014.  Sodium 141 potassium 4.9 BUN 25 creatinine 1.36.  AST elevated at 434 ALT 117 phosphatase 262 bilirubin 3.8.  Urine culture is pending.  Assessment and plan.  Failure to thrive question diaphoresis question septic process-one would be suspicious of an infection here with his presentation-I do note however his liver function tests are significantly elevated as well which would 1 month believed possibly more of a GI etiology.  I did discuss his status with his responsible party via phone-she has decided on very conservative treatment here does not really want anything invasive or further labs or antibiotic-again comfort will be the main goal-we did discuss medications and we will DC these except keep pain medications for comfort.  We will start Roxanol 5 mg every 4 hours when necessary pain does start with he does not appear to  be in acute distress certainly at this point but is at risk of this eventually he continues to decline.  Of note patient has had increased weakness lethargy failure to thrive symptoms before and bounced back however this time appears to be quite significant although cannot certainly rule this out however again responsible party and I have discussed this in she does not really want any aggressive treatment no hospitalization no feeding tube no antibiotic and no hospitalization and  will write orders to that effect.  [id number] note greater than 45 minutes spent assessing patient-discussing his status with nursing staff-reviewing labs-and discussion with his responsible party about his plan of care-actually  did see his responsible party in the facility later in the day as well.--Of note greater than 50% of time spent correlating plan of care with extensive responsible party input     .     Marland Kitchen Marland Kitchen

## 2014-09-24 NOTE — Progress Notes (Signed)
This encounter was created in error - please disregard.

## 2014-10-03 ENCOUNTER — Non-Acute Institutional Stay (SKILLED_NURSING_FACILITY): Payer: Medicare Other | Admitting: Internal Medicine

## 2014-10-03 DIAGNOSIS — R627 Adult failure to thrive: Secondary | ICD-10-CM

## 2014-10-03 DIAGNOSIS — G20 Parkinson's disease: Secondary | ICD-10-CM

## 2014-10-03 NOTE — Progress Notes (Signed)
Patient ID: Jeremiah Johnson, male   DOB: 1918/09/05, 81 y.o.   MRN: [id number]        this is an acute visit.  Level of care skilled.  Facility [facility] .  Chief complaint-acute visit secondary to question Parkinson symptoms  .  History of present illness.  Patient is a pleasant 81 year old male has been gradually declining here for several months.  He does have a history of dementia and Parkinson's disease-he has lost about 25 pounds over the past year.  Patient is under somewhat comfort measures with family desires for fairly minimal invasive procedures and labs    I saw him last month for what was thought to be a significant decline with concerns he possibly was septic-family did not want aggressive labs antibiotic statin and he is essentially again under comfort measures-nonetheless he appears to have bounced back quite significantly his back to ambulating about the facility in his wheelchair continues with significant dementia.  We did minimize his labs essentially is only receiving Roxanol as needed for pain as well as Ativan for anxiety-s\he had been on Sinemet with a history of Parkinson's according to family friend today appears to possibly have some increased tremors and they feel possibly he would benefit from reinitiation ofSinemet       -.  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  Medications have been reviewed per [facility] include.   At this time they're minimal including Ativan 0.5 mg every 4 hours when necessary as well as Roxanol 5 mg every 4 hours when necessary   .   Review of systems.   Quite limited secondary to dementia but is not complaining of pain shortness of breath or discomfort headache or dizziness  .  Physical exam Is afebrile pulse 56 respirations 17 blood pressure is pending  In general this is a frail elderly male sitting  up in his  wheelchair appears to be relatively back at his baseline  His skin is warm and dry  Eyes pupils do appear reactive to light.  Oropharynx difficult to tell patient would not really open his mouth.  Chest clear to auscultation with poor respiratory effort there is no labored breathing.  Heart is regular rate and rhythm slightly bradycardic at 56 has minimal lower extremity edema.  Abdomen is soft does not appear to be acutely tender there are positive bowel sounds.  Muscle skeletal  Is able to ambulate in a wheelchair has some mild contractures of his lower extremities for this actually appears improved from when I saw him last time-I do note a slight tremor of his upper extremities on the right more so than the left.--There is some cogwheeling bilaterally  Neurologic Tremors as noted above-he does move all extremities 4 actually appears stronger than when I saw him last time  Psych certainly speaking more than when I saw him last time again appears to be at his baseline is somewhat confused but follows simple verbal commands well.  Labs  .  08/29/2014.  Sodium 141 potassium 4.9 BUN 25 creatinine 1.36.  AST elevated at 434 ALT 117 phosphatase 262 bilirubin 3.8.  Urine culture is pending.  Assessment and plan.  1 failure to thrive-patient once again has rebounded fairly remarkably here again I did talk with his responsible party via phone today-she is okay with restarting the Sinemet again emphasis is on minimal medications-he appears  to be stable and comfortable.  Will continue current medications and add his Sinemet back to 25 100 mg 3 times a day and monitor  [id number]-of note greater than 30 minutes spent assessing patient-discussing his status with nursing staff as well as with his responsible party via phone-and coordinating formulating a plan of care with responsible party input--of note greater than 50% of time spent coordinating plan of care. .         .      . Marland Kitchen

## 2014-10-24 ENCOUNTER — Non-Acute Institutional Stay (SKILLED_NURSING_FACILITY): Payer: Medicare Other | Admitting: Internal Medicine

## 2014-10-24 DIAGNOSIS — H109 Unspecified conjunctivitis: Secondary | ICD-10-CM | POA: Diagnosis not present

## 2014-10-24 DIAGNOSIS — G20 Parkinson's disease: Secondary | ICD-10-CM | POA: Diagnosis not present

## 2014-10-24 NOTE — Progress Notes (Signed)
Patient ID: Jeremiah Johnson, male   DOB: 02/19/1918, 81 y.o.   MRN: [id number]         this is an acute visit.  Level of care skilled.  Facility [facility] .  Chief complaint-acute visit secondary to suspected conjunctivitis right eye--tremors  .  History of present illness.  Patient is a pleasant 81 year old male who friend have notice has developed some crusting and erythema of his right eye conjunctiva-patient has significant dementia cannot really give a review of systems but visual acuity appears intact he is not complaining of any pain.  Apparently the exudate is noticed more in the morning and clears during the day  He does have history of Parkinson's disease at one point he appeared to be declining significantly he is on essentially comfort care-nonetheless he is now more energetic back to his baseline rolling about facility in his wheelchair-family and friends did notice some increased tremors at times-his Sinemet which had been discontinued secondary to comfort care medication minimization efforts-was restarted 3 times a day he appears to have responded well to this              -.  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  Medications have been reviewed per [facility] include.   At this time they're minimal including Ativan 0.5 mg every 4 hours when necessary as well as Roxanol 5 mg every 4 hours when necessary  His Sinemet also has been restarted  25-100 mg a day TID   .   Review of systems.   Quite limited secondary to dementia but is not complaining of pain shortness of breath or discomfort headache or dizziness  .  Physical exam Is afebrile pulse 56 respirations 18   In general this is a frail elderly male sitting  up in his wheelchair appears to be relatively back at his baseline  His skin is warm and dry  Eyes pupils do appear reactive to  light--right eye does appear to have slightly erythematous conjunctivae and there is some yellowish brownish exudate in the medial margins-this is fairly scant but apparently more significant during the morning-pupil appears reactive to light left eye appears to be at baseline with no erythema.  Oropharynx difficult to tell patient would not really open his mouth.  Chest clear to auscultation with poor respiratory effort there is no labored breathing.  Heart is regular rate and rhythm slightly bradycardic at 56 has minimal lower extremity edema.  Muscle skeletal continues to ambulate about facility in a wheelchair does have some lower extremity contractures but is able to ambulate in his wheelchair  Neurologic-he is bright alert continues to be confused but does follow simple verbal commands with prompting-I do not note tremors today this had been in issue previously  Labs  .  08/29/2014.  Sodium 141 potassium 4.9 BUN 25 creatinine 1.36.  AST elevated at 434 ALT 117 phosphatase 262 bilirubin 3.8.  Urine culture is pending.  Assessment and plan.   Conjunctivitis-will treat with Cipro ophthalmologic solution 2 drops 3 times a day for 7 days and monitor.   failure to thrive this has been in issue in the past -- he is essentially on comfort care nonetheless he appears to have rebounded significantly his Sinemet has bee  nre started secondary to concern of some increased tremors and this appears to be helping at this point will monitor  [id number]. .         .     . Marland Kitchen

## 2015-03-17 ENCOUNTER — Non-Acute Institutional Stay (SKILLED_NURSING_FACILITY): Payer: Medicare Other | Admitting: Internal Medicine

## 2015-03-17 ENCOUNTER — Encounter: Payer: Self-pay | Admitting: Internal Medicine

## 2015-03-17 DIAGNOSIS — R627 Adult failure to thrive: Secondary | ICD-10-CM

## 2015-03-17 DIAGNOSIS — F0390 Unspecified dementia without behavioral disturbance: Secondary | ICD-10-CM | POA: Insufficient documentation

## 2015-03-17 NOTE — Progress Notes (Signed)
Patient ID: Jeremiah Johnson, male   DOB: 1918/03/06, 82 y.o.   MRN: [id number]          this is an acute visit.  Level of care skilled.  Facility [facility] .  Chief complaint-acute visit secondary to failure to thrive--end-of-life issues  .  History of present illness.   Patient is a pleasant 82 year old male with a history of dementia and Parkinson's disease-he does have somewhat of a history of failure to thrive and has been on comfort care but despite this has actually rebounded at times  and at one point was back ambulating in his wheelchair.  However recently patient has had significant decline now is essentially staying in bed increased weakness and responsiveness it appears he is approaching the end of life.  One point we had restarted his Sinemet secondary to him doing better i--  I did speak with his responsible party be a phone today she reiterated wishes for no aggressive interventions-essentially monitor his pain and keep him comfortable              -.  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  Medications have been reviewed per [facility] include.   At this time they're minimal including Ativan 0.5 mg every 4 hours when necessary as well as Roxanol 5 mg every 4 hours when necessary  His Sinemet also has been restarted  25-100 mg a day TID   .   Review of systems.   As noted above-again not really possible to do secondary to patient's relatively unresponsive status .  Physical exam He is afebrile pulse of 68 respirations 19 blood pressure pending.  In general this is a very frail elderly male lying in bed he is minimally responsive does not appear to be in any distress however.  His skin is warm and dry. Oropharynx mucous membranes appear dry.  Chest shallow air entry at could not appreciate any overt congestion there is poor respiratory  effort.  Heart is regular rate and rhythm with occasional irregular beats he does not have significant lower extremity edema.  Abdomen is soft does not appear to be tender there are positive bowel sounds.  Musculoskeletal he does have lower every contractures at baseline with some muscle wasting he does not really follow verbal commands extremely frail here.  Neurologic again he is minimally responsive.     Labs  .  08/29/2014.  Sodium 141 potassium 4.9 BUN 25 creatinine 1.36.  AST elevated at 434 ALT 117 phosphatase 262 bilirubin 3.8.  Urine culture is pending.  Assessment and plan.  Failure to thrive-I did speak with his responsible party via phone she reiterated wishes for no aggressive measures essentially comfort care-we'll start Roxanol routine every 4 hours 5 mg also will discontinue the Lexapro and Sinemet.  Continue to monitor and make medication adjustments as needed.  Currently he does not appear to be uncomfortable but will have to make adjustments certainly see continues to decline-prognosis continues to be very poor  [id number]-of note greater than 25 minutes spent assessing patient-discussing his status with nursing staff-as well as with his responsible party via phone     .         .     . Marland Kitchen

## 2015-03-18 ENCOUNTER — Other Ambulatory Visit: Payer: Self-pay

## 2015-03-18 MED ORDER — MORPHINE SULFATE (CONCENTRATE) 20 MG/ML PO SOLN: ORAL | Status: AC

## 2015-03-18 NOTE — Telephone Encounter (Signed)
RX faxed to [facility] @ [phone]. Phone number [phone]  

## 2015-03-20 ENCOUNTER — Encounter: Payer: Self-pay | Admitting: Internal Medicine

## 2015-03-20 ENCOUNTER — Non-Acute Institutional Stay (SKILLED_NURSING_FACILITY): Payer: Medicare Other | Admitting: Internal Medicine

## 2015-03-20 DIAGNOSIS — R627 Adult failure to thrive: Secondary | ICD-10-CM

## 2015-03-20 NOTE — Progress Notes (Signed)
Patient ID: Jeremiah Johnson, male   DOB: 1918/09/27, 82 y.o.   MRN: [id number]           this is an acute visit.  Level of care skilled.  Facility [facility] .  Chief complaint-acute visit follow-up failure to thrive-end-of-life concerns  .  History of present illness.   Patient is a pleasant 82 year old male with a history of dementia and Parkinson's disease-he does have somewhat of a history of failure to thrive and has been on comfort care but despite this has actually rebounded at times  and at one point was back ambulating in his wheelchair.  However recently patient has had significant decline now is essentially staying in bed increased weakness and responsiveness it appears he is approaching the end of life.  One point we had restarted his Sinemet secondary to him doing better i--  I did speak with his responsible party earlier this week she reiterated wishes for no aggressive interventions-essentially monitor his pain and keep him comfortable  His decline continues h--e is now lying in bed with open-mouth breathing although he does not appear to be in pain- is receiving Roxanol 5 mg every 4 hours routinely and every 2 hours when necessary he also is receiving Ativan as needed every 6 hours when necessary.  Vital signs actually are stable.  His family is in the room today and I did discuss end-of-life issues with them-they do understand that his prognosis is very poor and reiterated wishes for essentially comfort totally-with no aggressive interventions-              -.  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  Medications have been reviewed per [facility] include.   At this time they're minimal routine and every 2 hours when necessary Ativan 0.5 mg every 6 hours when necessary as well as Roxanol 5 mg every 4 hours   His Sinemet has been discontinued again meds  have been immunized   .   Review of systems.   As noted above-again not really possible to do secondary to patient's relatively unresponsive status .  Physical exam Temperature 98.3 pulse 75 respirations 20 blood pressure 100/60  In general this is a very frail elderly male lying in bed he is not responsive does not appear to be in any distress however.  His skin is warm and dry. Oropharynx mucous membranes appear dry.  Chest shallow air entry at could not appreciate any overt congestion there is poor respiratory effort.  Heart is regular rate and rhythm with occasional irregular beats he does not have significant lower extremity edema.  Abdomen is soft does not appear to be tender there are positive bowel sounds.  Musculoskeletal he does have lower every contractures at baseline with some muscle wasting he does not really follow verbal commands extremely frail here.  Neurologic again he is not responsive.     Labs  .  08/29/2014.  Sodium 141 potassium 4.9 BUN 25 creatinine 1.36.  AST elevated at 434 ALT 117 phosphatase 262 bilirubin 3.8.  Urine culture is pending.  Assessment and plan.  Failure to thrive Patient continues to decline he is not really not responsive I did discuss his status with family who are at bedside.  They reiterated wishes for comfort care-at this point the Roxanol scheduled as well as when necessary appears to be effective--as well as the  Ativan when necessary but this will have to be monitored his vital signs are stable but his prognosis remains extremely poor  [id number]    .         .     . Marland Kitchen

## 2015-03-22 MED ORDER — MORPHINE SULFATE 20 MG/5ML PO SOLN: 5.0000 mg | ORAL | Status: AC

## 2015-03-25 DEATH — deceased
